# Patient Record
Sex: Female | Born: 1985 | Race: Black or African American | Hispanic: No | Marital: Single | Smoking: Former smoker
Health system: Southern US, Community
[De-identification: ages and names within clinical notes are randomized; demographics above are authoritative.]

## PROBLEM LIST (undated history)

## (undated) DIAGNOSIS — Z3491 Encounter for supervision of normal pregnancy, unspecified, first trimester: Secondary | ICD-10-CM

## (undated) DIAGNOSIS — I10 Essential (primary) hypertension: Secondary | ICD-10-CM

## (undated) HISTORY — PX: MOUTH SURGERY: SHX715

---

## 2008-09-10 ENCOUNTER — Emergency Department (HOSPITAL_BASED_OUTPATIENT_CLINIC_OR_DEPARTMENT_OTHER): Admission: EM | Admit: 2008-09-10 | Discharge: 2008-09-10 | Payer: Self-pay | Admitting: Emergency Medicine

## 2010-03-01 ENCOUNTER — Emergency Department (HOSPITAL_BASED_OUTPATIENT_CLINIC_OR_DEPARTMENT_OTHER): Admission: EM | Admit: 2010-03-01 | Discharge: 2010-03-01 | Payer: Self-pay | Admitting: Emergency Medicine

## 2010-05-07 ENCOUNTER — Emergency Department (HOSPITAL_BASED_OUTPATIENT_CLINIC_OR_DEPARTMENT_OTHER)
Admission: EM | Admit: 2010-05-07 | Discharge: 2010-05-07 | Payer: Self-pay | Source: Home / Self Care | Admitting: Emergency Medicine

## 2010-07-29 LAB — WET PREP, GENITAL
Trich, Wet Prep: NONE SEEN
Yeast Wet Prep HPF POC: NONE SEEN

## 2010-07-29 LAB — URINE MICROSCOPIC-ADD ON

## 2010-07-29 LAB — URINALYSIS, ROUTINE W REFLEX MICROSCOPIC
Glucose, UA: NEGATIVE mg/dL
Hgb urine dipstick: NEGATIVE
Specific Gravity, Urine: 1.024 (ref 1.005–1.030)
Urobilinogen, UA: 0.2 mg/dL (ref 0.0–1.0)
pH: 7.5 (ref 5.0–8.0)

## 2010-07-29 LAB — GC/CHLAMYDIA PROBE AMP, GENITAL: Chlamydia, DNA Probe: NEGATIVE

## 2010-07-30 ENCOUNTER — Emergency Department (HOSPITAL_BASED_OUTPATIENT_CLINIC_OR_DEPARTMENT_OTHER)
Admission: EM | Admit: 2010-07-30 | Discharge: 2010-07-30 | Disposition: A | Discharge status: Home / Self Care | Payer: BC Managed Care – PPO | Attending: Emergency Medicine | Admitting: Emergency Medicine

## 2010-07-30 ENCOUNTER — Inpatient Hospital Stay (HOSPITAL_COMMUNITY)
Admission: AD | Admit: 2010-07-30 | Discharge: 2010-07-30 | Disposition: A | Discharge status: Home / Self Care | Payer: BC Managed Care – PPO | Source: Ambulatory Visit | Attending: Obstetrics & Gynecology | Admitting: Obstetrics & Gynecology

## 2010-07-30 ENCOUNTER — Encounter (HOSPITAL_BASED_OUTPATIENT_CLINIC_OR_DEPARTMENT_OTHER): Payer: Self-pay | Admitting: Radiology

## 2010-07-30 ENCOUNTER — Emergency Department (INDEPENDENT_AMBULATORY_CARE_PROVIDER_SITE_OTHER): Payer: BC Managed Care – PPO

## 2010-07-30 DIAGNOSIS — O2690 Pregnancy related conditions, unspecified, unspecified trimester: Secondary | ICD-10-CM | POA: Insufficient documentation

## 2010-07-30 DIAGNOSIS — O9989 Other specified diseases and conditions complicating pregnancy, childbirth and the puerperium: Secondary | ICD-10-CM

## 2010-07-30 DIAGNOSIS — R109 Unspecified abdominal pain: Secondary | ICD-10-CM | POA: Insufficient documentation

## 2010-07-30 DIAGNOSIS — O210 Mild hyperemesis gravidarum: Secondary | ICD-10-CM

## 2010-07-30 DIAGNOSIS — O99891 Other specified diseases and conditions complicating pregnancy: Secondary | ICD-10-CM | POA: Insufficient documentation

## 2010-07-30 LAB — WET PREP, GENITAL

## 2010-07-30 LAB — URINALYSIS, ROUTINE W REFLEX MICROSCOPIC
Bilirubin Urine: NEGATIVE
Nitrite: NEGATIVE
Specific Gravity, Urine: 1.02 (ref 1.005–1.030)
pH: 6.5 (ref 5.0–8.0)

## 2010-07-30 LAB — HCG, QUANTITATIVE, PREGNANCY: hCG, Beta Chain, Quant, S: 2579 m[IU]/mL — ABNORMAL HIGH (ref <5)

## 2010-07-30 LAB — URINE MICROSCOPIC-ADD ON

## 2010-07-31 LAB — GC/CHLAMYDIA PROBE AMP, GENITAL
Chlamydia, DNA Probe: NEGATIVE
GC Probe Amp, Genital: NEGATIVE

## 2010-07-31 LAB — URINE CULTURE

## 2010-08-01 ENCOUNTER — Inpatient Hospital Stay (HOSPITAL_COMMUNITY): Payer: BC Managed Care – PPO

## 2010-08-01 ENCOUNTER — Inpatient Hospital Stay (HOSPITAL_COMMUNITY)
Admission: AD | Admit: 2010-08-01 | Discharge: 2010-08-01 | Disposition: A | Discharge status: Home / Self Care | Payer: BC Managed Care – PPO | Source: Ambulatory Visit | Attending: Family Medicine | Admitting: Family Medicine

## 2010-08-01 ENCOUNTER — Other Ambulatory Visit (HOSPITAL_COMMUNITY): Payer: BC Managed Care – PPO

## 2010-08-01 DIAGNOSIS — O99891 Other specified diseases and conditions complicating pregnancy: Secondary | ICD-10-CM | POA: Insufficient documentation

## 2010-08-01 DIAGNOSIS — O9989 Other specified diseases and conditions complicating pregnancy, childbirth and the puerperium: Secondary | ICD-10-CM

## 2010-08-01 DIAGNOSIS — O368390 Maternal care for abnormalities of the fetal heart rate or rhythm, unspecified trimester, not applicable or unspecified: Secondary | ICD-10-CM

## 2010-08-01 LAB — CBC
HCT: 28.3 % — ABNORMAL LOW (ref 36.0–46.0)
Hemoglobin: 9.3 g/dL — ABNORMAL LOW (ref 12.0–15.0)
MCH: 27 pg (ref 26.0–34.0)
MCHC: 33 g/dL (ref 30.0–36.0)

## 2010-08-01 LAB — COMPREHENSIVE METABOLIC PANEL
ALT: 17 U/L (ref 0–35)
CO2: 25 mEq/L (ref 19–32)
Calcium: 8.8 mg/dL (ref 8.4–10.5)
Chloride: 108 mEq/L (ref 96–112)
Creatinine, Ser: 0.8 mg/dL (ref 0.4–1.2)
GFR calc non Af Amer: >60 mL/min (ref >60)
Glucose, Bld: 102 mg/dL — ABNORMAL HIGH (ref 70–99)
Sodium: 138 mEq/L (ref 135–145)
Total Bilirubin: 0.4 mg/dL (ref 0.3–1.2)

## 2010-08-01 LAB — LIPASE, BLOOD: Lipase: 39 U/L (ref 23–300)

## 2010-08-01 LAB — URINE CULTURE

## 2010-08-01 LAB — URINE MICROSCOPIC-ADD ON

## 2010-08-01 LAB — DIFFERENTIAL
Basophils Absolute: 0 10*3/uL (ref 0.0–0.1)
Eosinophils Absolute: 0 10*3/uL (ref 0.0–0.7)
Lymphs Abs: 2 10*3/uL (ref 0.7–4.0)
Neutrophils Relative %: 58 % (ref 43–77)

## 2010-08-01 LAB — URINALYSIS, ROUTINE W REFLEX MICROSCOPIC
Glucose, UA: NEGATIVE mg/dL
Hgb urine dipstick: NEGATIVE
Specific Gravity, Urine: 1.028 (ref 1.005–1.030)
pH: 7 (ref 5.0–8.0)

## 2010-08-01 LAB — HCG, QUANTITATIVE, PREGNANCY: hCG, Beta Chain, Quant, S: 4715 m[IU]/mL — ABNORMAL HIGH (ref <5)

## 2010-09-02 ENCOUNTER — Other Ambulatory Visit (HOSPITAL_COMMUNITY): Payer: Self-pay | Admitting: Obstetrics and Gynecology

## 2010-09-02 DIAGNOSIS — Z3682 Encounter for antenatal screening for nuchal translucency: Secondary | ICD-10-CM

## 2010-09-18 ENCOUNTER — Ambulatory Visit (HOSPITAL_COMMUNITY)
Admission: RE | Admit: 2010-09-18 | Discharge: 2010-09-18 | Disposition: A | Discharge status: Home / Self Care | Payer: BC Managed Care – PPO | Source: Ambulatory Visit | Attending: Obstetrics and Gynecology | Admitting: Obstetrics and Gynecology

## 2010-09-18 ENCOUNTER — Other Ambulatory Visit (HOSPITAL_COMMUNITY): Payer: Self-pay | Admitting: Obstetrics and Gynecology

## 2010-09-18 ENCOUNTER — Ambulatory Visit (HOSPITAL_COMMUNITY)
Admission: RE | Admit: 2010-09-18 | Payer: BC Managed Care – PPO | Source: Ambulatory Visit | Attending: *Deleted | Admitting: *Deleted

## 2010-09-18 DIAGNOSIS — O09299 Supervision of pregnancy with other poor reproductive or obstetric history, unspecified trimester: Secondary | ICD-10-CM | POA: Insufficient documentation

## 2010-09-18 DIAGNOSIS — O351XX0 Maternal care for (suspected) chromosomal abnormality in fetus, not applicable or unspecified: Secondary | ICD-10-CM | POA: Insufficient documentation

## 2010-09-18 DIAGNOSIS — Z3682 Encounter for antenatal screening for nuchal translucency: Secondary | ICD-10-CM

## 2010-09-18 DIAGNOSIS — Z3689 Encounter for other specified antenatal screening: Secondary | ICD-10-CM | POA: Insufficient documentation

## 2010-09-18 DIAGNOSIS — O3510X0 Maternal care for (suspected) chromosomal abnormality in fetus, unspecified, not applicable or unspecified: Secondary | ICD-10-CM | POA: Insufficient documentation

## 2010-09-25 ENCOUNTER — Ambulatory Visit (HOSPITAL_COMMUNITY)
Admission: RE | Admit: 2010-09-25 | Discharge: 2010-09-25 | Disposition: A | Discharge status: Home / Self Care | Payer: BC Managed Care – PPO | Source: Ambulatory Visit | Attending: *Deleted | Admitting: *Deleted

## 2010-09-25 ENCOUNTER — Other Ambulatory Visit (HOSPITAL_COMMUNITY): Payer: Self-pay | Admitting: Obstetrics and Gynecology

## 2010-09-25 ENCOUNTER — Ambulatory Visit (HOSPITAL_COMMUNITY)
Admission: RE | Admit: 2010-09-25 | Discharge: 2010-09-25 | Disposition: A | Discharge status: Home / Self Care | Payer: BC Managed Care – PPO | Source: Ambulatory Visit | Attending: Obstetrics and Gynecology | Admitting: Obstetrics and Gynecology

## 2010-09-25 DIAGNOSIS — O3510X0 Maternal care for (suspected) chromosomal abnormality in fetus, unspecified, not applicable or unspecified: Secondary | ICD-10-CM | POA: Insufficient documentation

## 2010-09-25 DIAGNOSIS — Z0489 Encounter for examination and observation for other specified reasons: Secondary | ICD-10-CM

## 2010-09-25 DIAGNOSIS — Z3689 Encounter for other specified antenatal screening: Secondary | ICD-10-CM | POA: Insufficient documentation

## 2010-09-25 DIAGNOSIS — O351XX0 Maternal care for (suspected) chromosomal abnormality in fetus, not applicable or unspecified: Secondary | ICD-10-CM | POA: Insufficient documentation

## 2010-09-25 DIAGNOSIS — O09299 Supervision of pregnancy with other poor reproductive or obstetric history, unspecified trimester: Secondary | ICD-10-CM | POA: Insufficient documentation

## 2010-09-25 DIAGNOSIS — Z3682 Encounter for antenatal screening for nuchal translucency: Secondary | ICD-10-CM

## 2010-10-30 ENCOUNTER — Other Ambulatory Visit (HOSPITAL_COMMUNITY): Payer: Self-pay | Admitting: Obstetrics and Gynecology

## 2010-10-30 ENCOUNTER — Ambulatory Visit (HOSPITAL_COMMUNITY)
Admission: RE | Admit: 2010-10-30 | Discharge: 2010-10-30 | Disposition: A | Discharge status: Home / Self Care | Payer: BC Managed Care – PPO | Source: Ambulatory Visit | Attending: Obstetrics and Gynecology | Admitting: Obstetrics and Gynecology

## 2010-10-30 DIAGNOSIS — Z363 Encounter for antenatal screening for malformations: Secondary | ICD-10-CM | POA: Insufficient documentation

## 2010-10-30 DIAGNOSIS — Z0489 Encounter for examination and observation for other specified reasons: Secondary | ICD-10-CM

## 2010-10-30 DIAGNOSIS — O358XX0 Maternal care for other (suspected) fetal abnormality and damage, not applicable or unspecified: Secondary | ICD-10-CM | POA: Insufficient documentation

## 2010-10-30 DIAGNOSIS — Z1389 Encounter for screening for other disorder: Secondary | ICD-10-CM | POA: Insufficient documentation

## 2011-06-24 IMAGING — US US OB DETAIL+14 WK
1 series · 14 of 28 positions shown · non-contrast
Comparison: none

[Series 1: us ob detail+14 wk · 14 of 93 slices shown]
[im 4/93]
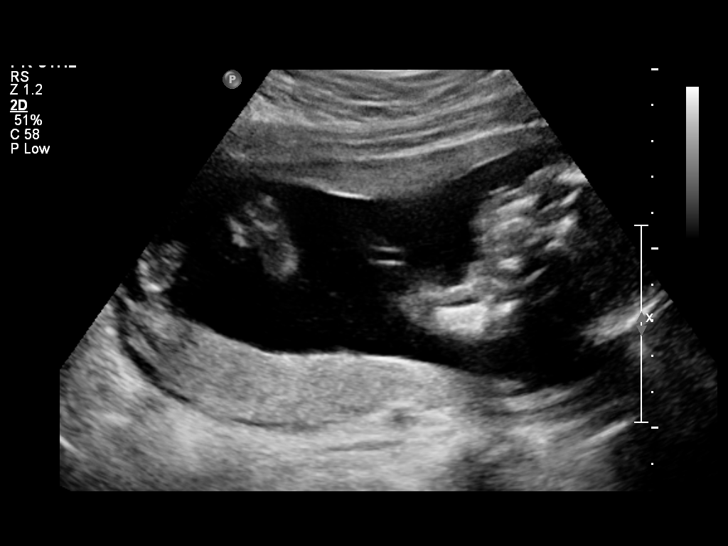
[im 11/93]
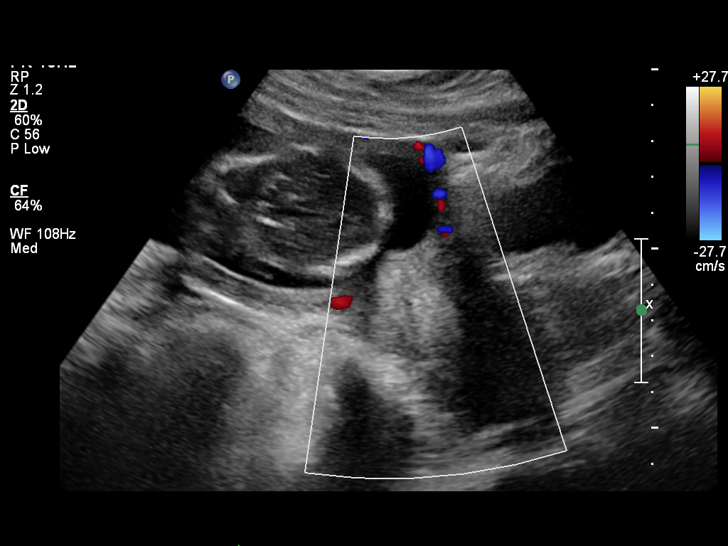
[im 18/93]
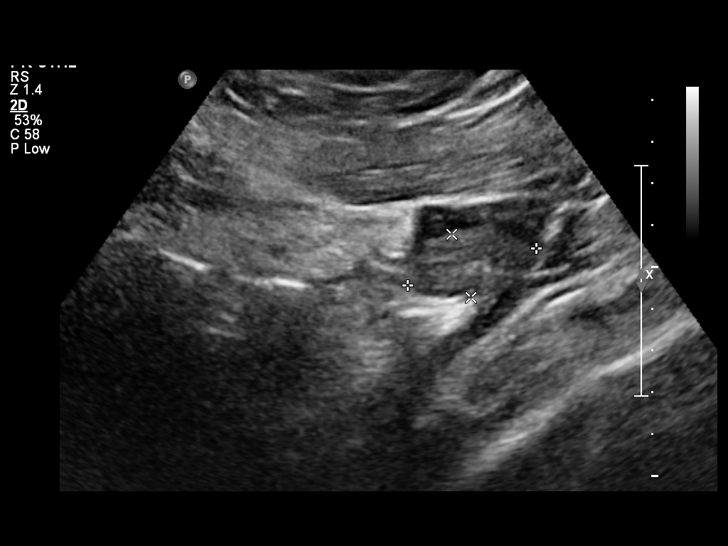
[im 24/93]
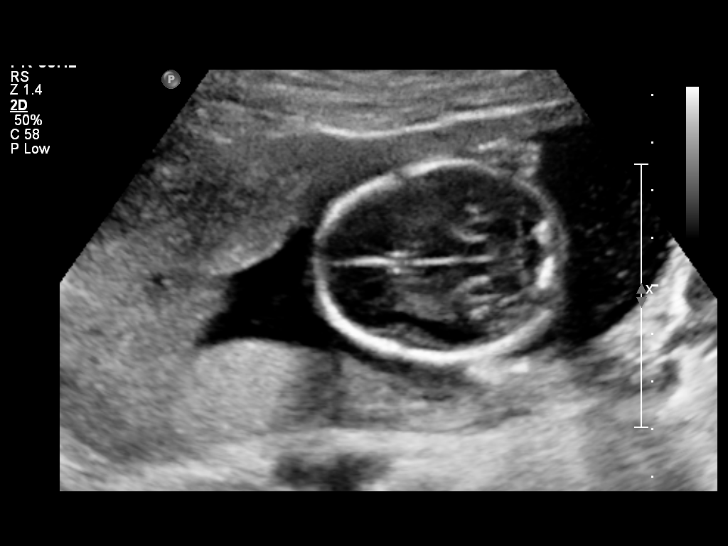
[im 31/93]
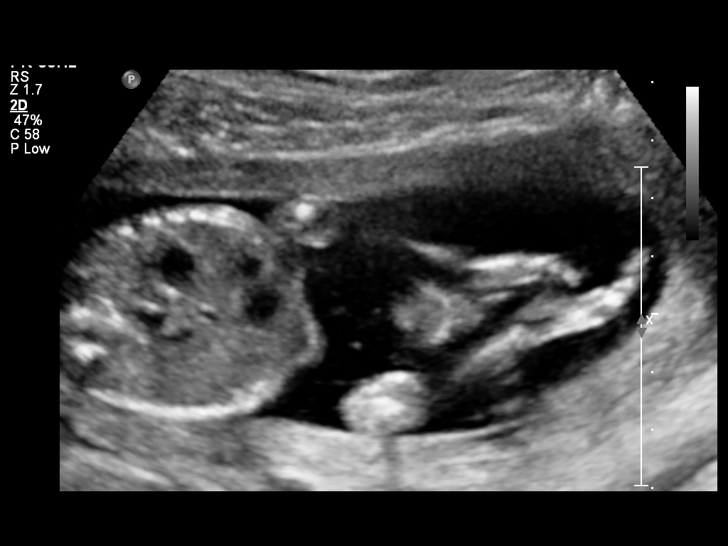
[im 38/93]
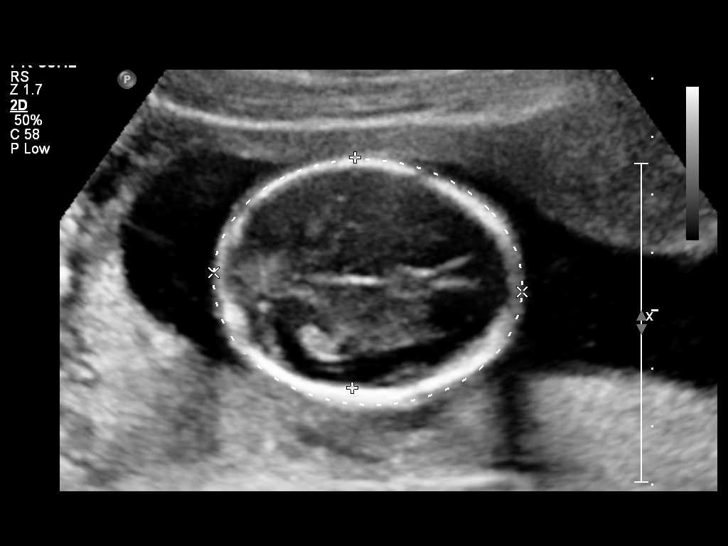
[im 45/93]
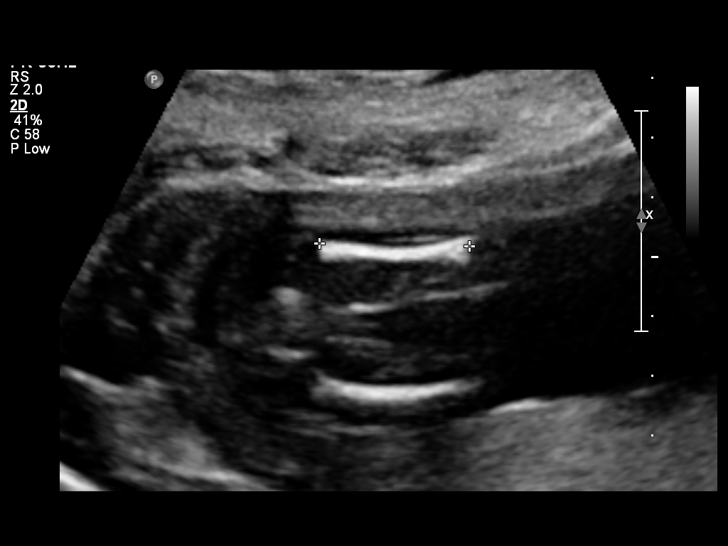
[im 52/93]
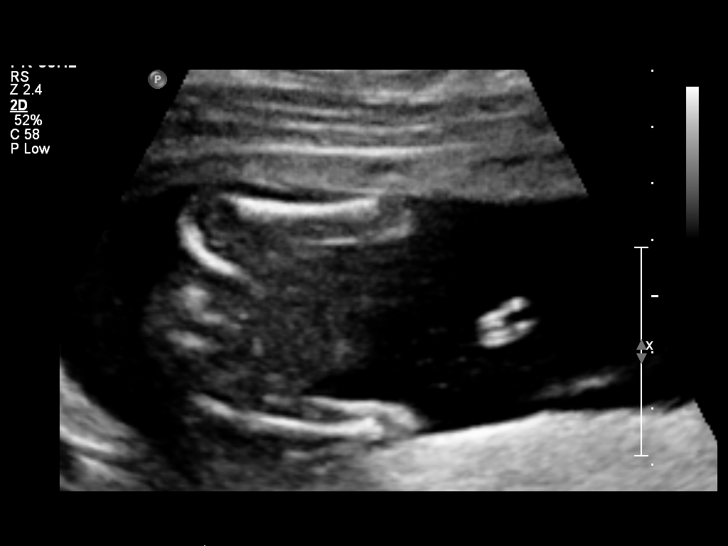
[im 58/93]
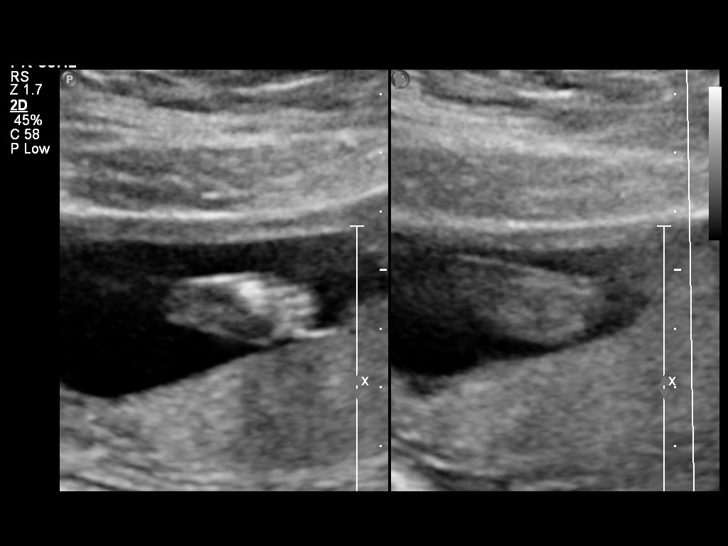
[im 65/93]
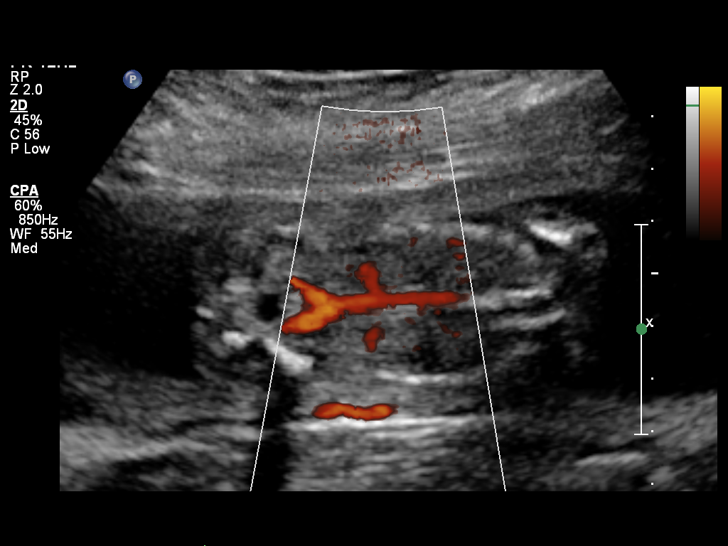
[im 72/93]
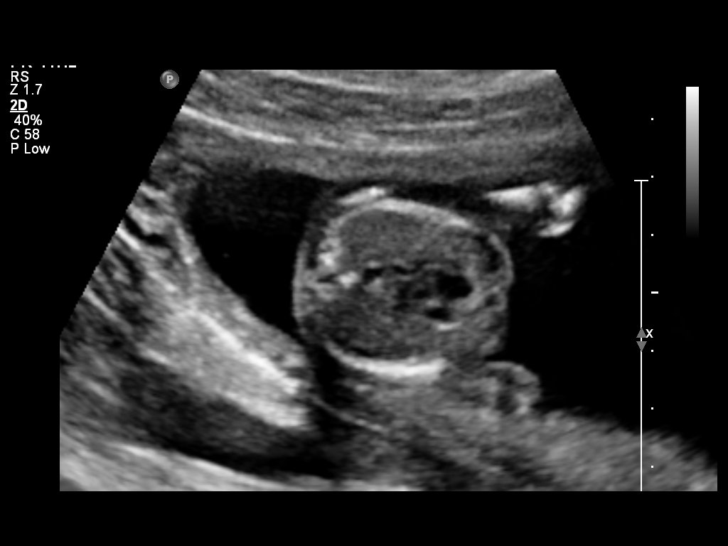
[im 79/93]
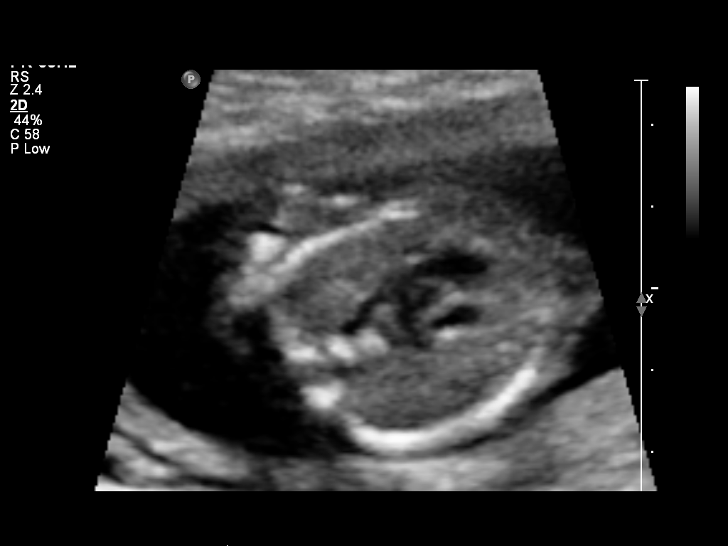
[im 86/93]
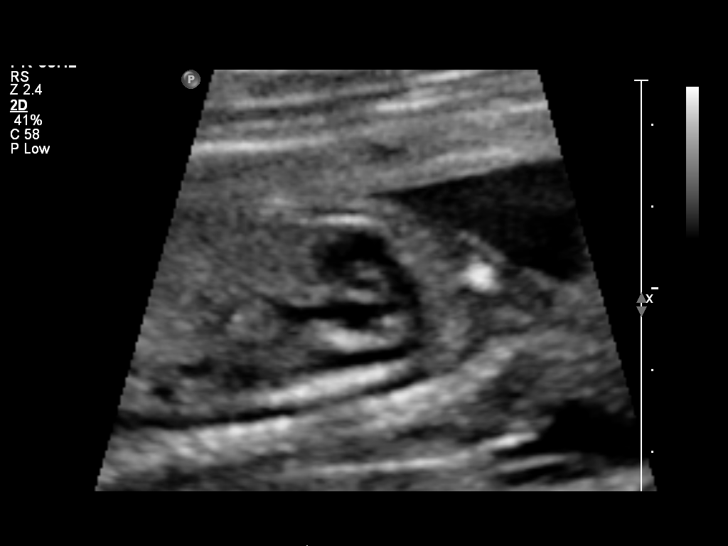
[im 93/93]
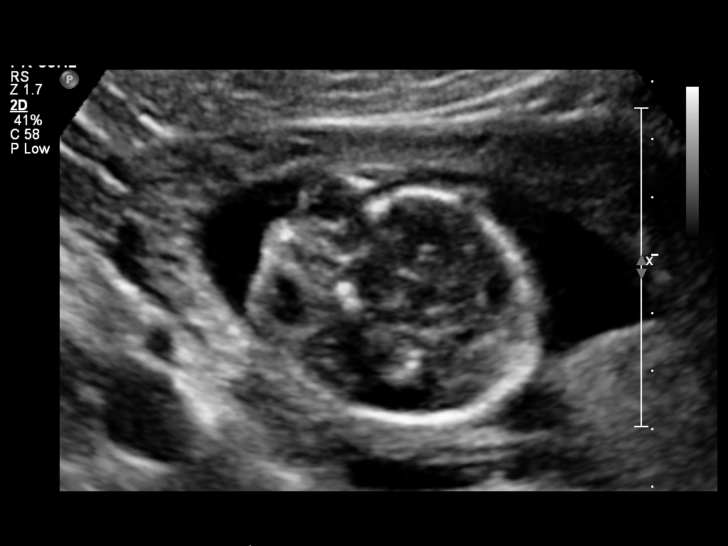

[14 of 28 positions shown; findings below may reference images not displayed]

Canned report from images found in remote index.

Refer to host system for actual result text.

## 2012-05-06 ENCOUNTER — Emergency Department (HOSPITAL_BASED_OUTPATIENT_CLINIC_OR_DEPARTMENT_OTHER)
Admission: EM | Admit: 2012-05-06 | Discharge: 2012-05-06 | Disposition: A | Discharge status: Home / Self Care | Payer: Self-pay | Attending: Emergency Medicine | Admitting: Emergency Medicine

## 2012-05-06 ENCOUNTER — Encounter (HOSPITAL_BASED_OUTPATIENT_CLINIC_OR_DEPARTMENT_OTHER): Payer: Self-pay | Admitting: Family Medicine

## 2012-05-06 DIAGNOSIS — R51 Headache: Secondary | ICD-10-CM | POA: Insufficient documentation

## 2012-05-06 DIAGNOSIS — B9789 Other viral agents as the cause of diseases classified elsewhere: Secondary | ICD-10-CM | POA: Insufficient documentation

## 2012-05-06 DIAGNOSIS — B349 Viral infection, unspecified: Secondary | ICD-10-CM

## 2012-05-06 DIAGNOSIS — J3489 Other specified disorders of nose and nasal sinuses: Secondary | ICD-10-CM | POA: Insufficient documentation

## 2012-05-06 DIAGNOSIS — J029 Acute pharyngitis, unspecified: Secondary | ICD-10-CM | POA: Insufficient documentation

## 2012-05-06 DIAGNOSIS — R0982 Postnasal drip: Secondary | ICD-10-CM | POA: Insufficient documentation

## 2012-05-06 MED ORDER — DEXAMETHASONE SODIUM PHOSPHATE 10 MG/ML IJ SOLN
10.0000 mg | Freq: Once | INTRAMUSCULAR | Status: AC
  Administered 2012-05-06: 10 mg via INTRAMUSCULAR

## 2012-05-06 MED ORDER — LIDOCAINE VISCOUS 2 % MT SOLN: 20.0000 mL | OROMUCOSAL | Status: DC | PRN

## 2012-05-06 MED ORDER — IBUPROFEN 400 MG PO TABS
600.0000 mg | ORAL_TABLET | Freq: Once | ORAL | Status: AC
  Administered 2012-05-06: 600 mg via ORAL
  Filled 2012-05-06: qty 1

## 2012-05-06 MED ORDER — DEXAMETHASONE SODIUM PHOSPHATE 10 MG/ML IJ SOLN
10.0000 mg | Freq: Once | INTRAMUSCULAR | Status: DC
  Filled 2012-05-06: qty 1

## 2012-05-06 MED ORDER — IBUPROFEN 600 MG PO TABS: 600.0000 mg | ORAL_TABLET | Freq: Four times a day (QID) | ORAL | Status: DC | PRN

## 2012-05-06 MED ORDER — LIDOCAINE VISCOUS 2 % MT SOLN
20.0000 mL | Freq: Once | OROMUCOSAL | Status: AC
  Administered 2012-05-06: 20 mL via OROMUCOSAL
  Filled 2012-05-06: qty 15

## 2012-05-06 NOTE — ED Provider Notes (Signed)
History     CSN: [id number]  Arrival date & time 05/06/12  1018   First MD Initiated Contact with Patient 05/06/12 1200      Chief Complaint  Patient presents with  . Sore Throat    (Consider location/radiation/quality/duration/timing/severity/associated sxs/prior treatment) HPI Comments: Pt comes in with cc of sore throat and a headache. Pt's sx started few days back with some congestion and tearing of the eye - but stating last night she has been having sore throat, with some pain with swallowing and change in voice. No fevers, chills. Pt is coughing - mostly celar, but some yellow discharge mixed as well. She has no wheezing, no hx of lung disease, and she is not a smoker. Pt also has a mild headache, no meds taken, with no nausea, vomiting, visual complains, seizures, altered mental status, loss of consciousness, new weakness, or numbness, no gait instability.  Patient is a 26 y.o. female presenting with pharyngitis. The history is provided by the patient.  Sore Throat Pertinent negatives include no chest pain, no abdominal pain, no headaches and no shortness of breath.    History reviewed. No pertinent past medical history.  History reviewed. No pertinent past surgical history.  No family history on file.  History  Substance Use Topics  . Smoking status: Never Smoker   . Smokeless tobacco: Not on file  . Alcohol Use: Yes    OB History    Grav Para Term Preterm Abortions TAB SAB Ect Mult Living   1               Review of Systems  HENT: Positive for congestion, sore throat and postnasal drip. Negative for drooling, trouble swallowing and neck pain.   Eyes: Negative for visual disturbance.  Respiratory: Negative for shortness of breath.   Cardiovascular: Negative for chest pain.  Gastrointestinal: Negative for nausea, vomiting and abdominal pain.  Genitourinary: Negative for dysuria.  Neurological: Negative for headaches.    Allergies  Review of patient's  allergies indicates no known allergies.  Home Medications   Current Outpatient Rx  Name  Route  Sig  Dispense  Refill  . IBUPROFEN 600 MG PO TABS   Oral   Take 1 tablet (600 mg total) by mouth every 6 (six) hours as needed for pain.   30 tablet   0   . LIDOCAINE VISCOUS 2 % MT SOLN   Oral   Take 20 mLs by mouth as needed for pain.   100 mL   0     BP 124/85  Pulse 84  Temp 98.4 F (36.9 C) (Oral)  Resp 16  Ht 5\' 3"  (1.6 m)  Wt 237 lb (107.502 kg)  BMI 41.98 kg/m2  SpO2 100%  LMP 04/19/2012  Breastfeeding? Unknown  Physical Exam  Nursing note and vitals reviewed. Constitutional: She is oriented to person, place, and time. She appears well-developed and well-nourished.  HENT:  Head: Normocephalic and atraumatic.  Mouth/Throat: No oropharyngeal exudate.  Eyes: EOM are normal. Pupils are equal, round, and reactive to light.  Neck: Neck supple. No JVD present.  Cardiovascular: Normal rate, regular rhythm and normal heart sounds.   No murmur heard. Pulmonary/Chest: Effort normal. No respiratory distress.  Abdominal: Soft. She exhibits no distension. There is no tenderness. There is no rebound and no guarding.  Lymphadenopathy:    She has cervical adenopathy.  Neurological: She is alert and oriented to person, place, and time. No cranial nerve deficit. Coordination normal.  Skin: Skin  is warm and dry.    ED Course  Procedures (including critical care time)   Labs Reviewed  RAPID STREP SCREEN   No results found.   1. Viral syndrome   2. Pharyngitis       MDM  Non toxic appearing female comes in with cc of sore throat and a mild headache.  Pt's exam is benign. Has cervical lymphadenopathy, but no other abnormalities. Headache has no redflags either.  Likely viral syndrome.    Derwood Kaplan, MD 05/06/12 1441

## 2012-05-06 NOTE — ED Notes (Signed)
Pt c/o headache, dry cough and sore throat x 2 days. Denies fever, n/v/d.

## 2014-03-20 ENCOUNTER — Encounter (HOSPITAL_BASED_OUTPATIENT_CLINIC_OR_DEPARTMENT_OTHER): Payer: Self-pay | Admitting: Family Medicine

## 2015-08-31 ENCOUNTER — Encounter (HOSPITAL_BASED_OUTPATIENT_CLINIC_OR_DEPARTMENT_OTHER): Payer: Self-pay | Admitting: *Deleted

## 2015-08-31 DIAGNOSIS — S199XXA Unspecified injury of neck, initial encounter: Secondary | ICD-10-CM | POA: Diagnosis not present

## 2015-08-31 DIAGNOSIS — R51 Headache: Secondary | ICD-10-CM | POA: Insufficient documentation

## 2015-08-31 DIAGNOSIS — S8002XA Contusion of left knee, initial encounter: Secondary | ICD-10-CM | POA: Insufficient documentation

## 2015-08-31 DIAGNOSIS — Y939 Activity, unspecified: Secondary | ICD-10-CM | POA: Diagnosis not present

## 2015-08-31 DIAGNOSIS — M79604 Pain in right leg: Secondary | ICD-10-CM | POA: Diagnosis not present

## 2015-08-31 DIAGNOSIS — Y999 Unspecified external cause status: Secondary | ICD-10-CM | POA: Insufficient documentation

## 2015-08-31 DIAGNOSIS — S8992XA Unspecified injury of left lower leg, initial encounter: Secondary | ICD-10-CM | POA: Diagnosis present

## 2015-08-31 DIAGNOSIS — M549 Dorsalgia, unspecified: Secondary | ICD-10-CM | POA: Insufficient documentation

## 2015-08-31 DIAGNOSIS — Y929 Unspecified place or not applicable: Secondary | ICD-10-CM | POA: Insufficient documentation
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
## 2015-09-01 ENCOUNTER — Encounter (HOSPITAL_BASED_OUTPATIENT_CLINIC_OR_DEPARTMENT_OTHER): Payer: Self-pay | Admitting: Emergency Medicine

## 2015-09-01 ENCOUNTER — Emergency Department (HOSPITAL_BASED_OUTPATIENT_CLINIC_OR_DEPARTMENT_OTHER)
Admission: EM | Admit: 2015-09-01 | Discharge: 2015-09-01 | Disposition: A | Discharge status: Home / Self Care | Payer: No Typology Code available for payment source | Attending: Emergency Medicine | Admitting: Emergency Medicine

## 2015-09-01 DIAGNOSIS — T148XXA Other injury of unspecified body region, initial encounter: Secondary | ICD-10-CM

## 2015-09-01 MED ORDER — NAPROXEN 250 MG PO TABS
500.0000 mg | ORAL_TABLET | Freq: Once | ORAL | Status: AC
  Administered 2015-09-01: 500 mg via ORAL
  Filled 2015-09-01: qty 2

## 2015-09-01 MED ORDER — METHOCARBAMOL 500 MG PO TABS: 500.0000 mg | ORAL_TABLET | Freq: Two times a day (BID) | ORAL | Status: DC

## 2015-09-01 MED ORDER — METHOCARBAMOL 500 MG PO TABS
1000.0000 mg | ORAL_TABLET | Freq: Once | ORAL | Status: AC
  Administered 2015-09-01: 1000 mg via ORAL
  Filled 2015-09-01: qty 2

## 2015-09-01 MED ORDER — NAPROXEN 375 MG PO TABS: 375.0000 mg | ORAL_TABLET | Freq: Two times a day (BID) | ORAL | Status: DC
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
## 2015-09-22 ENCOUNTER — Encounter (HOSPITAL_BASED_OUTPATIENT_CLINIC_OR_DEPARTMENT_OTHER): Payer: Self-pay | Admitting: *Deleted

## 2015-09-22 ENCOUNTER — Emergency Department (HOSPITAL_BASED_OUTPATIENT_CLINIC_OR_DEPARTMENT_OTHER)
Admission: EM | Admit: 2015-09-22 | Discharge: 2015-09-22 | Disposition: A | Discharge status: Home / Self Care | Payer: No Typology Code available for payment source | Attending: Emergency Medicine | Admitting: Emergency Medicine

## 2015-09-22 DIAGNOSIS — A084 Viral intestinal infection, unspecified: Secondary | ICD-10-CM

## 2015-09-22 DIAGNOSIS — Z79899 Other long term (current) drug therapy: Secondary | ICD-10-CM | POA: Insufficient documentation

## 2015-09-22 LAB — CBC WITH DIFFERENTIAL/PLATELET
BASOS PCT: 0 %
Basophils Absolute: 0 10*3/uL (ref 0.0–0.1)
EOS ABS: 0 10*3/uL (ref 0.0–0.7)
Eosinophils Relative: 0 %
HEMATOCRIT: 34.8 % — AB (ref 36.0–46.0)
HEMOGLOBIN: 11.3 g/dL — AB (ref 12.0–15.0)
LYMPHS ABS: 0.7 10*3/uL (ref 0.7–4.0)
Lymphocytes Relative: 15 %
MCH: 28.5 pg (ref 26.0–34.0)
MCHC: 32.5 g/dL (ref 30.0–36.0)
MCV: 87.7 fL (ref 78.0–100.0)
Monocytes Absolute: 0.3 10*3/uL (ref 0.1–1.0)
Monocytes Relative: 7 %
NEUTROS ABS: 3.8 10*3/uL (ref 1.7–7.7)
Neutrophils Relative %: 78 %
Platelets: 263 10*3/uL (ref 150–400)
RBC: 3.97 MIL/uL (ref 3.87–5.11)
RDW: 12.4 % (ref 11.5–15.5)
WBC: 4.8 10*3/uL (ref 4.0–10.5)

## 2015-09-22 LAB — COMPREHENSIVE METABOLIC PANEL
ALBUMIN: 3.5 g/dL (ref 3.5–5.0)
ALK PHOS: 91 U/L (ref 38–126)
ALT: 26 U/L (ref 14–54)
AST: 22 U/L (ref 15–41)
Anion gap: 5 (ref 5–15)
BILIRUBIN TOTAL: 0.6 mg/dL (ref 0.3–1.2)
BUN: 10 mg/dL (ref 6–20)
CALCIUM: 8.8 mg/dL — AB (ref 8.9–10.3)
CO2: 26 mmol/L (ref 22–32)
CREATININE: 0.9 mg/dL (ref 0.44–1.00)
Chloride: 104 mmol/L (ref 101–111)
GFR calc Af Amer: >60 mL/min (ref >60)
GFR calc non Af Amer: >60 mL/min (ref >60)
GLUCOSE: 86 mg/dL (ref 65–99)
Potassium: 3.3 mmol/L — ABNORMAL LOW (ref 3.5–5.1)
SODIUM: 135 mmol/L (ref 135–145)
Total Protein: 7.1 g/dL (ref 6.5–8.1)

## 2015-09-22 LAB — URINALYSIS, ROUTINE W REFLEX MICROSCOPIC
BILIRUBIN URINE: NEGATIVE
Glucose, UA: NEGATIVE mg/dL
Ketones, ur: 40 mg/dL — AB
NITRITE: NEGATIVE
Protein, ur: NEGATIVE mg/dL
SPECIFIC GRAVITY, URINE: 1.024 (ref 1.005–1.030)
pH: 6.5 (ref 5.0–8.0)

## 2015-09-22 LAB — URINE MICROSCOPIC-ADD ON

## 2015-09-22 LAB — LIPASE, BLOOD: Lipase: 15 U/L (ref 11–51)

## 2015-09-22 LAB — PREGNANCY, URINE: PREG TEST UR: NEGATIVE

## 2015-09-22 MED ORDER — ONDANSETRON HCL 4 MG/2ML IJ SOLN
4.0000 mg | Freq: Once | INTRAMUSCULAR | Status: AC
  Administered 2015-09-22: 4 mg via INTRAVENOUS
  Filled 2015-09-22: qty 2

## 2015-09-22 MED ORDER — DICYCLOMINE HCL 20 MG PO TABS: 20.0000 mg | ORAL_TABLET | Freq: Two times a day (BID) | ORAL | Status: DC

## 2015-09-22 MED ORDER — ONDANSETRON 4 MG PO TBDP: 4.0000 mg | ORAL_TABLET | Freq: Three times a day (TID) | ORAL | Status: DC | PRN

## 2015-09-22 MED ORDER — DIPHENOXYLATE-ATROPINE 2.5-0.025 MG PO TABS: 1.0000 | ORAL_TABLET | Freq: Four times a day (QID) | ORAL | Status: DC | PRN

## 2015-09-22 MED ORDER — PANTOPRAZOLE SODIUM 40 MG IV SOLR
40.0000 mg | Freq: Once | INTRAVENOUS | Status: AC
Start: 2015-09-22 — End: 2015-09-22
  Administered 2015-09-22: 40 mg via INTRAVENOUS
  Filled 2015-09-22: qty 40

## 2015-09-22 MED ORDER — MORPHINE SULFATE (PF) 4 MG/ML IV SOLN
4.0000 mg | INTRAVENOUS | Status: DC | PRN
  Administered 2015-09-22: 4 mg via INTRAVENOUS
  Filled 2015-09-22: qty 1

## 2015-09-22 NOTE — ED Provider Notes (Signed)
CSN: [id number]     Arrival date & time 09/22/15  4098 History  By signing my name below, I, [facility], attest that this documentation has been prepared under the direction and in the presence of Rolland Porter, MD. Electronically Signed: Randell Patient, ED Scribe. 09/22/2015. 8:40 PM.   Chief Complaint  Patient presents with  . Abdominal Pain   The history is provided by the patient. No language interpreter was used.   HPI Comments: Dawn Heath is a 30 y.o. female who presents to the Emergency Department complaining of constant, 8/10 abdominal pain onset last night. She describes that pain as cramping like contractions. Pt states that she began to vomit 18 hours ago. She reports associated nausea, chills, and diarrhea this morning upon waking. She is an occasional ETOH consumer. Denies hx of abdominal surgeries, cholecystitis, or any other chronic conditions. Denies frequent use of NSAIDs, ibuprofen, Tylenol or any other medications. Denies hematemesis, hematochezia, melena, or any other symptoms.  History reviewed. No pertinent past medical history. History reviewed. No pertinent past surgical history. History reviewed. No pertinent family history. Social History  Substance Use Topics  . Smoking status: Never Smoker   . Smokeless tobacco: None  . Alcohol Use: Yes     Comment: social   OB History    Gravida Para Term Preterm AB TAB SAB Ectopic Multiple Living   1              Review of Systems  Constitutional: Positive for chills. Negative for fever, diaphoresis, appetite change and fatigue.  HENT: Negative for mouth sores, sore throat and trouble swallowing.   Eyes: Negative for visual disturbance.  Respiratory: Negative for cough, chest tightness, shortness of breath and wheezing.   Cardiovascular: Negative for chest pain.  Gastrointestinal: Positive for nausea, vomiting and abdominal pain. Negative for diarrhea, blood in stool and abdominal distention.  Endocrine:  Negative for polydipsia, polyphagia and polyuria.  Genitourinary: Negative for dysuria, frequency and hematuria.  Musculoskeletal: Negative for gait problem.  Skin: Negative for color change, pallor and rash.  Neurological: Negative for dizziness, syncope, light-headedness and headaches.  Hematological: Does not bruise/bleed easily.  Psychiatric/Behavioral: Negative for behavioral problems and confusion.      Allergies  Review of patient's allergies indicates no known allergies.  Home Medications   Prior to Admission medications   Medication Sig Start Date End Date Taking? Authorizing Provider  methocarbamol (ROBAXIN) 500 MG tablet Take 1 tablet (500 mg total) by mouth 2 (two) times daily. 09/01/15  Yes April Palumbo, MD  dicyclomine (BENTYL) 20 MG tablet Take 1 tablet (20 mg total) by mouth 2 (two) times daily. 09/22/15   Rolland Porter, MD  diphenoxylate-atropine (LOMOTIL) 2.5-0.025 MG tablet Take 1 tablet by mouth 4 (four) times daily as needed for diarrhea or loose stools. 09/22/15   Rolland Porter, MD  naproxen (NAPROSYN) 375 MG tablet Take 1 tablet (375 mg total) by mouth 2 (two) times daily. 09/01/15   April Palumbo, MD  ondansetron (ZOFRAN ODT) 4 MG disintegrating tablet Take 1 tablet (4 mg total) by mouth every 8 (eight) hours as needed for nausea. 09/22/15   Rolland Porter, MD   BP 118/82 mmHg  Pulse 78  Temp(Src) 99 F (37.2 C) (Oral)  Resp 18  Ht  (1.6 m)  Wt 238 lb (107.956 kg)  BMI 42.17 kg/m2  SpO2 96%  LMP 09/06/2015 (Approximate) Physical Exam  Constitutional: She is oriented to person, place, and time. She appears well-developed and well-nourished. No  distress.  HENT:  Head: Normocephalic.  Eyes: Conjunctivae are normal. Pupils are equal, round, and reactive to light. No scleral icterus.  Neck: Normal range of motion. Neck supple. No thyromegaly present.  Cardiovascular: Normal rate and regular rhythm.  Exam reveals no gallop and no friction rub.   No murmur  heard. Pulmonary/Chest: Effort normal and breath sounds normal. No respiratory distress. She has no wheezes. She has no rales.  Abdominal: Soft. Bowel sounds are normal. She exhibits no distension. There is tenderness in the epigastric area. There is no rebound.  Tenderness over the epigastrium.  Musculoskeletal: Normal range of motion.  Neurological: She is alert and oriented to person, place, and time.  Skin: Skin is warm and dry. No rash noted.  Psychiatric: She has a normal mood and affect. Her behavior is normal.    ED Course  Procedures   DIAGNOSTIC STUDIES: Oxygen Saturation is 100% on RA, normal by my interpretation.    COORDINATION OF CARE: 7:00 PM Will order labs, Zofran, Protonix, and morphine. Discussed treatment plan with pt at bedside and pt agreed to plan.  8:39 PM Returned to check on pt and discuss lab results. Will discharge home.  Labs Review Labs Reviewed  URINALYSIS, ROUTINE W REFLEX MICROSCOPIC (NOT [facility]) - Abnormal; Notable for the following:    Color, Urine AMBER (*)    APPearance CLOUDY (*)    Hgb urine dipstick SMALL (*)    Ketones, ur 40 (*)    Leukocytes, UA SMALL (*)    All other components within normal limits  CBC WITH DIFFERENTIAL/PLATELET - Abnormal; Notable for the following:    Hemoglobin 11.3 (*)    HCT 34.8 (*)    All other components within normal limits  COMPREHENSIVE METABOLIC PANEL - Abnormal; Notable for the following:    Potassium 3.3 (*)    Calcium 8.8 (*)    All other components within normal limits  URINE MICROSCOPIC-ADD ON - Abnormal; Notable for the following:    Squamous Epithelial / LPF 6-30 (*)    Bacteria, UA MANY (*)    All other components within normal limits  PREGNANCY, URINE  LIPASE, BLOOD   I have personally reviewed and evaluated these lab results as part of my medical decision-making.   MDM   Final diagnoses:  Viral gastroenteritis    I personally performed the services described in this  documentation, which was scribed in my presence. The recorded information has been reviewed and is accurate.    EKG Interpretation None         Rolland PorterMark Ellawyn Wogan, MD 09/22/15 2046

## 2015-09-22 NOTE — ED Notes (Signed)
Pt given d/c instructions as per chart. Verbalizes understanding. No questions. Rx x 3 

## 2015-09-22 NOTE — ED Notes (Signed)
Pt c/o abdominal pain with N/V since 0100hrs, states unable to keep any food or liquids down

## 2015-09-22 NOTE — ED Notes (Signed)
Up to b/r, steady gait, "feel the same, pain worse".

## 2016-01-21 ENCOUNTER — Encounter (HOSPITAL_BASED_OUTPATIENT_CLINIC_OR_DEPARTMENT_OTHER): Payer: Self-pay

## 2016-01-21 ENCOUNTER — Emergency Department (HOSPITAL_BASED_OUTPATIENT_CLINIC_OR_DEPARTMENT_OTHER)
Admission: EM | Admit: 2016-01-21 | Discharge: 2016-01-21 | Disposition: A | Discharge status: Home / Self Care | Payer: Medicaid Other | Attending: Emergency Medicine | Admitting: Emergency Medicine

## 2016-01-21 DIAGNOSIS — N943 Premenstrual tension syndrome: Secondary | ICD-10-CM | POA: Insufficient documentation

## 2016-01-21 DIAGNOSIS — G43831 Menstrual migraine, intractable, with status migrainosus: Secondary | ICD-10-CM | POA: Insufficient documentation

## 2016-01-21 DIAGNOSIS — N938 Other specified abnormal uterine and vaginal bleeding: Secondary | ICD-10-CM

## 2016-01-21 DIAGNOSIS — F17200 Nicotine dependence, unspecified, uncomplicated: Secondary | ICD-10-CM | POA: Insufficient documentation

## 2016-01-21 LAB — URINE MICROSCOPIC-ADD ON

## 2016-01-21 LAB — CBC WITH DIFFERENTIAL/PLATELET
BASOS ABS: 0 10*3/uL (ref 0.0–0.1)
BASOS PCT: 0 %
EOS PCT: 0 %
Eosinophils Absolute: 0 10*3/uL (ref 0.0–0.7)
HCT: 34.7 % — ABNORMAL LOW (ref 36.0–46.0)
Hemoglobin: 11.2 g/dL — ABNORMAL LOW (ref 12.0–15.0)
LYMPHS PCT: 31 %
Lymphs Abs: 1.6 10*3/uL (ref 0.7–4.0)
MCH: 28.8 pg (ref 26.0–34.0)
MCHC: 32.3 g/dL (ref 30.0–36.0)
MCV: 89.2 fL (ref 78.0–100.0)
MONO ABS: 0.5 10*3/uL (ref 0.1–1.0)
Monocytes Relative: 11 %
NEUTROS ABS: 2.9 10*3/uL (ref 1.7–7.7)
Neutrophils Relative %: 58 %
PLATELETS: 283 10*3/uL (ref 150–400)
RBC: 3.89 MIL/uL (ref 3.87–5.11)
RDW: 12.3 % (ref 11.5–15.5)
WBC: 5.1 10*3/uL (ref 4.0–10.5)

## 2016-01-21 LAB — WET PREP, GENITAL
Sperm: NONE SEEN
TRICH WET PREP: NONE SEEN
YEAST WET PREP: NONE SEEN

## 2016-01-21 LAB — URINALYSIS, ROUTINE W REFLEX MICROSCOPIC
Bilirubin Urine: NEGATIVE
Glucose, UA: NEGATIVE mg/dL
KETONES UR: NEGATIVE mg/dL
LEUKOCYTES UA: NEGATIVE
NITRITE: NEGATIVE
PROTEIN: NEGATIVE mg/dL
Specific Gravity, Urine: 1.02 (ref 1.005–1.030)
pH: 7.5 (ref 5.0–8.0)

## 2016-01-21 LAB — PREGNANCY, URINE: PREG TEST UR: NEGATIVE

## 2016-01-21 MED ORDER — KETOROLAC TROMETHAMINE 30 MG/ML IJ SOLN
30.0000 mg | Freq: Once | INTRAMUSCULAR | Status: AC
  Administered 2016-01-21: 30 mg via INTRAVENOUS
  Filled 2016-01-21: qty 1

## 2016-01-21 MED ORDER — MEDROXYPROGESTERONE ACETATE 5 MG PO TABS: 5.0000 mg | ORAL_TABLET | Freq: Every day | ORAL | 0 refills | Status: DC

## 2016-01-21 MED ORDER — DEXAMETHASONE SODIUM PHOSPHATE 10 MG/ML IJ SOLN
10.0000 mg | Freq: Once | INTRAMUSCULAR | Status: AC
  Administered 2016-01-21: 10 mg via INTRAVENOUS
  Filled 2016-01-21: qty 1

## 2016-01-21 MED ORDER — METOCLOPRAMIDE HCL 5 MG/ML IJ SOLN
10.0000 mg | Freq: Once | INTRAMUSCULAR | Status: AC
  Administered 2016-01-21: 10 mg via INTRAVENOUS
  Filled 2016-01-21: qty 2

## 2016-01-21 NOTE — ED Triage Notes (Signed)
C/o HA x 2 days-reports long hx of HAs-no migraine dx-NAD-steady gait

## 2016-01-21 NOTE — ED Provider Notes (Signed)
[facility]   CSN: [id number] Arrival date & time: 01/21/16  1142     History   Chief Complaint Chief Complaint  Patient presents with  . Headache    HPI Dawn Heath is a 30 y.o. female.  Pt states she has an IUD and needs removal as it has been present for 5 years.  Has not f/u with Ob/Gyn but until this last month has never had a problem with menses or heavy cycles.  Also noted over the last week an unusual smell with the bleeding.  Sexually active with 1 partner but does not use protection.   The history is provided by the patient.  Headache   This is a recurrent problem. The current episode started yesterday. The problem occurs constantly. The problem has been gradually worsening. The headache is associated with the menstrual cycle (has been having heavy vaginal bleeding 4-6 pads daily for the last month). The pain is located in the bilateral and frontal region. The quality of the pain is described as throbbing and dull. The pain is at a severity of 7/10. The pain is moderate. The pain does not radiate. Pertinent negatives include no anorexia, no malaise/fatigue, no chest pressure, no palpitations, no syncope, no nausea and no vomiting. She has tried nothing for the symptoms. The treatment provided no relief.    History reviewed. No pertinent past medical history.  There are no active problems to display for this patient.   History reviewed. No pertinent surgical history.  OB History    Gravida Para Term Preterm AB Living   1             SAB TAB Ectopic Multiple Live Births                   Home Medications    Prior to Admission medications   Medication Sig Start Date End Date Taking? Authorizing Provider  medroxyPROGESTERone (PROVERA) 5 MG tablet Take 1 tablet (5 mg total) by mouth daily. 01/21/16   Gwyneth Sprout, MD    Family History No family history on file.  Social History Social History  Substance Use Topics  . Smoking  status: Current Every Day Smoker  . Smokeless tobacco: Never Used  . Alcohol use Yes     Comment: social     Allergies   Review of patient's allergies indicates no known allergies.   Review of Systems Review of Systems  Constitutional: Negative for malaise/fatigue.  Cardiovascular: Negative for palpitations and syncope.  Gastrointestinal: Negative for anorexia, nausea and vomiting.  Neurological: Positive for headaches.  All other systems reviewed and are negative.    Physical Exam Updated Vital Signs BP 129/83 (BP Location: Left Arm)   Pulse 78   Temp 98.1 F (36.7 C) (Oral)   Resp 16   Ht 5\' 4"  (1.626 m)   Wt 238 lb (108 kg)   LMP 12/21/2015   SpO2 100%   BMI 40.85 kg/m   Physical Exam  Constitutional: She is oriented to person, place, and time. She appears well-developed and well-nourished. No distress.  HENT:  Head: Normocephalic and atraumatic.  Mouth/Throat: Oropharynx is clear and moist.  Eyes: Conjunctivae and EOM are normal. Pupils are equal, round, and reactive to light. Right eye exhibits no discharge. Left eye exhibits no discharge.  photophobia  Neck: Normal range of motion. Neck supple. No spinous process tenderness present. No neck rigidity. No Brudzinski's sign and no Kernig's sign noted.  Cardiovascular: Normal rate,  regular rhythm, normal heart sounds and intact distal pulses.   No murmur heard. Pulmonary/Chest: Effort normal and breath sounds normal. No respiratory distress. She has no wheezes. She has no rales.  Abdominal: Soft. She exhibits no distension. There is no tenderness. There is no rebound and no guarding.  Genitourinary: Uterus normal. Cervix exhibits no motion tenderness, no discharge and no friability. Right adnexum displays no mass, no tenderness and no fullness. Left adnexum displays no mass, no tenderness and no fullness. There is bleeding in the vagina. No vaginal discharge found.  Genitourinary Comments: IUD strings present and  removed with ringed forceps  Musculoskeletal: Normal range of motion. She exhibits no edema or tenderness.  Lymphadenopathy:    She has no cervical adenopathy.  Neurological: She is alert and oriented to person, place, and time. She has normal strength. No cranial nerve deficit or sensory deficit. Coordination and gait normal. GCS eye subscore is 4. GCS verbal subscore is 5. GCS motor subscore is 6.  Skin: Skin is warm and dry. No rash noted. No erythema.  Psychiatric: She has a normal mood and affect. Her behavior is normal.  Nursing note and vitals reviewed.    ED Treatments / Results  Labs (all labs ordered are listed, but only abnormal results are displayed) Labs Reviewed  WET PREP, GENITAL - Abnormal; Notable for the following:       Result Value   Clue Cells Wet Prep HPF POC PRESENT (*)    WBC, Wet Prep HPF POC FEW (*)    All other components within normal limits  CBC WITH DIFFERENTIAL/PLATELET - Abnormal; Notable for the following:    Hemoglobin 11.2 (*)    HCT 34.7 (*)    All other components within normal limits  URINALYSIS, ROUTINE W REFLEX MICROSCOPIC (NOT AT [facility]) - Abnormal; Notable for the following:    Hgb urine dipstick TRACE (*)    All other components within normal limits  URINE MICROSCOPIC-ADD ON - Abnormal; Notable for the following:    Squamous Epithelial / LPF 0-5 (*)    Bacteria, UA FEW (*)    All other components within normal limits  PREGNANCY, URINE  GC/CHLAMYDIA PROBE AMP ([place]) NOT AT [facility]    EKG  EKG Interpretation None       Radiology No results found.  Procedures Procedures (including critical care time)  Medications Ordered in ED Medications  metoCLOPramide (REGLAN) injection 10 mg (10 mg Intravenous Given 01/21/16 1247)  ketorolac (TORADOL) 30 MG/ML injection 30 mg (30 mg Intravenous Given 01/21/16 1247)  dexamethasone (DECADRON) injection 10 mg (10 mg Intravenous Given 01/21/16 1247)     Initial Impression / Assessment and  Plan / ED Course  I have reviewed the triage vital signs and the nursing notes.  Pertinent labs & imaging results that were available during my care of the patient were reviewed by me and considered in my medical decision making (see chart for details).  Clinical Course   Pt with typical migraine HA without sx suggestive of SAH(sudden onset, worst of life, or deficits), infection, or cavernous vein thrombosis.  Normal neuro exam and vital signs. Will give HA cocktail and on re-eval pt feeling much better.  Pt's migraine probably hormone related.  Secondly patient has had ongoing vaginal bleeding for the last 1 month with IUD present. IUD has been present for approximately 5 years and patient is requesting that it be removed. She has not yet made follow-up with OB/GYN. She denies any weakness, chest  pain or shortness of breath but states she has been using 4-6 pads daily for the last month due to bleeding. She also is requesting testing for STDs. On exam patient has no abdominal pain minimal vaginal bleeding and no cervical motion tenderness. Wet prep positive for clue cells but no other significant findings. This time patient chooses to wait for culture results before getting treated as there is a low suspicion for STD.  Also patient given Provera if bleeding does not stop and follow-up with OB/GYN   Final Clinical Impressions(s) / ED Diagnoses   Final diagnoses:  Intractable menstrual migraine with status migrainosus  DUB (dysfunctional uterine bleeding)    New Prescriptions New Prescriptions   MEDROXYPROGESTERONE (PROVERA) 5 MG TABLET    Take 1 tablet (5 mg total) by mouth daily.     Gwyneth SproutWhitney Drina Jobst, MD 01/21/16 [phone]

## 2016-01-22 LAB — GC/CHLAMYDIA PROBE AMP (~~LOC~~) NOT AT ARMC
CHLAMYDIA, DNA PROBE: NEGATIVE
Neisseria Gonorrhea: NEGATIVE

## 2016-09-08 ENCOUNTER — Emergency Department (HOSPITAL_BASED_OUTPATIENT_CLINIC_OR_DEPARTMENT_OTHER)
Admission: EM | Admit: 2016-09-08 | Discharge: 2016-09-08 | Disposition: A | Discharge status: Home / Self Care | Payer: Medicaid Other | Attending: Emergency Medicine | Admitting: Emergency Medicine

## 2016-09-08 ENCOUNTER — Encounter (HOSPITAL_BASED_OUTPATIENT_CLINIC_OR_DEPARTMENT_OTHER): Payer: Self-pay | Admitting: *Deleted

## 2016-09-08 DIAGNOSIS — G43829 Menstrual migraine, not intractable, without status migrainosus: Secondary | ICD-10-CM

## 2016-09-08 DIAGNOSIS — F17200 Nicotine dependence, unspecified, uncomplicated: Secondary | ICD-10-CM | POA: Insufficient documentation

## 2016-09-08 MED ORDER — SODIUM CHLORIDE 0.9 % IV BOLUS (SEPSIS)
1000.0000 mL | Freq: Once | INTRAVENOUS | Status: AC
  Administered 2016-09-08: 1000 mL via INTRAVENOUS

## 2016-09-08 MED ORDER — KETOROLAC TROMETHAMINE 30 MG/ML IJ SOLN
30.0000 mg | Freq: Once | INTRAMUSCULAR | Status: AC
  Administered 2016-09-08: 30 mg via INTRAVENOUS
  Filled 2016-09-08: qty 1

## 2016-09-08 MED ORDER — PROCHLORPERAZINE EDISYLATE 5 MG/ML IJ SOLN
10.0000 mg | Freq: Once | INTRAMUSCULAR | Status: AC
  Administered 2016-09-08: 10 mg via INTRAVENOUS
  Filled 2016-09-08: qty 2

## 2016-09-08 MED ORDER — DIPHENHYDRAMINE HCL 50 MG/ML IJ SOLN
25.0000 mg | Freq: Once | INTRAMUSCULAR | Status: AC
  Administered 2016-09-08: 25 mg via INTRAVENOUS
  Filled 2016-09-08: qty 1

## 2016-09-08 NOTE — ED Provider Notes (Signed)
[facility]   CSN: [id number] Arrival date & time: 09/08/16  1143     History   Chief Complaint Chief Complaint  Patient presents with  . Migraine    HPI Dawn Heath is a 31 y.o. female.  HPI   Pt with hx headaches p/w left temporal headache with sensitivity to light and sound, nausea that began 3 days ago.  Has been taking Excedrin and tylenol without significant improvement.  LMP was last week, was one week early and longer than usual.  She does usually get headaches like this around her period.  Denies fevers, recent illness.    History reviewed. No pertinent past medical history.  There are no active problems to display for this patient.   History reviewed. No pertinent surgical history.  OB History    Gravida Para Term Preterm AB Living   1             SAB TAB Ectopic Multiple Live Births                   Home Medications    Prior to Admission medications   Medication Sig Start Date End Date Taking? Authorizing Provider  medroxyPROGESTERone (PROVERA) 5 MG tablet Take 1 tablet (5 mg total) by mouth daily. 01/21/16   Gwyneth Sprout, MD    Family History No family history on file.  Social History Social History  Substance Use Topics  . Smoking status: Current Every Day Smoker  . Smokeless tobacco: Never Used  . Alcohol use Yes     Comment: social     Allergies   Patient has no known allergies.   Review of Systems Review of Systems  All other systems reviewed and are negative.    Physical Exam Updated Vital Signs BP 121/79 (BP Location: Left Arm)   Pulse 68   Temp 99.4 F (37.4 C) (Oral)   Resp 16   Ht  (1.6 m)   Wt 111.1 kg   LMP 09/01/2016   SpO2 100%   BMI 43.40 kg/m   Physical Exam  Constitutional: She appears well-developed and well-nourished. No distress.  HENT:  Head: Normocephalic and atraumatic.  Neck: Neck supple.  Cardiovascular: Normal rate and regular rhythm.   Pulmonary/Chest:  Effort normal and breath sounds normal. No respiratory distress. She has no wheezes. She has no rales.  Neurological: She is alert.  CN II-XII intact, EOMs intact, no pronator drift, grip strengths equal bilaterally; strength 5/5 in all extremities, sensation intact in all extremities; finger to nose, heel to shin, rapid alternating movements normal.     Skin: She is not diaphoretic.  Nursing note and vitals reviewed.    ED Treatments / Results  Labs (all labs ordered are listed, but only abnormal results are displayed) Labs Reviewed - No data to display  EKG  EKG Interpretation None       Radiology No results found.  Procedures Procedures (including critical care time)  Medications Ordered in ED Medications  sodium chloride 0.9 % bolus 1,000 mL (0 mLs Intravenous Stopped 09/08/16 1418)  ketorolac (TORADOL) 30 MG/ML injection 30 mg (30 mg Intravenous Given 09/08/16 1239)  prochlorperazine (COMPAZINE) injection 10 mg (10 mg Intravenous Given 09/08/16 1238)  diphenhydrAMINE (BENADRYL) injection 25 mg (25 mg Intravenous Given 09/08/16 1240)     Initial Impression / Assessment and Plan / ED Course  I have reviewed the triage vital signs and the nursing notes.  Pertinent labs & imaging results  that were available during my care of the patient were reviewed by me and considered in my medical decision making (see chart for details).  Clinical Course as of Sep 09 1435  Mon Sep 08, 2016  1404 Pt reports headache is relieved.    [EW]    Clinical Course User Index [EW] Trixie Dredge, PA-C    Afebrile, nontoxic patient with typical migraine  headache.  No red flags including head trauma, fevers, meningeal signs, focal neurologic deficits.  Migraine cocktail  given with relief.    D/C home with PCP follow up.  Discussed result, findings, treatment, and follow up  with patient.  Pt given return precautions.  Pt verbalizes understanding and agrees with plan.     Final Clinical  Impressions(s) / ED Diagnoses   Final diagnoses:  Menstrual migraine without status migrainosus, not intractable    New Prescriptions Discharge Medication List as of 09/08/2016  2:05 PM       Trixie Dredge, PA-C 09/08/16 1438    Doug Sou, MD 09/08/16 1541

## 2016-09-08 NOTE — ED Notes (Signed)
ED Provider at bedside for assessment.  

## 2016-09-08 NOTE — ED Notes (Signed)
Pt unable to give UA at this time 

## 2016-09-08 NOTE — Discharge Instructions (Signed)
Read the information below.  You may return to the Emergency Department at any time for worsening condition or any new symptoms that concern you.  ° °You are having a headache. No specific cause was found today for your headache. It may have been a migraine or other cause of headache. Stress, anxiety, fatigue, and depression are common triggers for headaches. Your headache today does not appear to be life-threatening or require hospitalization, but often the exact cause of headaches is not determined in the emergency department. Therefore, follow-up with your doctor is very important to find out what may have caused your headache, and whether or not you need any further diagnostic testing or treatment. Sometimes headaches can appear benign (not harmful), but then more serious symptoms can develop which should prompt an immediate re-evaluation by your doctor or the emergency department. °SEEK MEDICAL ATTENTION IF: °You develop possible problems with medications prescribed.  °The medications don't resolve your headache, if it recurs , or if you have multiple episodes of vomiting or can't take fluids. °You have a change from the usual headache. °RETURN IMMEDIATELY IF you develop a sudden, severe headache or confusion, become poorly responsive or faint, develop a fever above 100.4F or problem breathing, have a change in speech, vision, swallowing, or understanding, or develop new weakness, numbness, tingling, incoordination, or have a seizure.  °

## 2016-09-08 NOTE — ED Triage Notes (Signed)
Headache for a week. No relief with OTC headache relief.

## 2017-03-17 ENCOUNTER — Encounter (HOSPITAL_BASED_OUTPATIENT_CLINIC_OR_DEPARTMENT_OTHER): Payer: Self-pay | Admitting: *Deleted

## 2017-03-17 ENCOUNTER — Emergency Department (HOSPITAL_BASED_OUTPATIENT_CLINIC_OR_DEPARTMENT_OTHER)
Admission: EM | Admit: 2017-03-17 | Discharge: 2017-03-17 | Disposition: A | Discharge status: Home / Self Care | Payer: Self-pay | Attending: Emergency Medicine | Admitting: Emergency Medicine

## 2017-03-17 DIAGNOSIS — N762 Acute vulvitis: Secondary | ICD-10-CM

## 2017-03-17 DIAGNOSIS — Z79899 Other long term (current) drug therapy: Secondary | ICD-10-CM | POA: Insufficient documentation

## 2017-03-17 DIAGNOSIS — N764 Abscess of vulva: Secondary | ICD-10-CM | POA: Insufficient documentation

## 2017-03-17 DIAGNOSIS — Z87891 Personal history of nicotine dependence: Secondary | ICD-10-CM | POA: Insufficient documentation

## 2017-03-17 MED ORDER — CLINDAMYCIN HCL 150 MG PO CAPS: 300.0000 mg | ORAL_CAPSULE | Freq: Four times a day (QID) | ORAL | 0 refills | Status: DC

## 2017-03-17 MED ORDER — TRAMADOL HCL 50 MG PO TABS: 50.0000 mg | ORAL_TABLET | Freq: Four times a day (QID) | ORAL | 0 refills | Status: DC | PRN

## 2017-03-17 MED FILL — traMADol HCL 50 MG TABS: 50 | 3 days supply | Qty: 15 | Fill #0

## 2017-03-17 MED FILL — CLINDAMYCIN HCL 150 MG CAPS: 150 | 5 days supply | Qty: 42 | Fill #0

## 2017-03-17 NOTE — ED Provider Notes (Signed)
[facility] EMERGENCY DEPARTMENT Provider Note   CSN: [id number] Arrival date & time: 03/17/17  [id number]     History   Chief Complaint Chief Complaint  Patient presents with  . Abscess    HPI Dawn Heath is a 31 y.o. female.  HPI Dawn Heath is a 31 y.o. female presents to ED with complaint of swelling to left labia. Symptoms began 2 days ago. Pain is worsened with movement. Symptoms worsening. Pain is burning 7/10. Denies fever, chills. No abdominal pain. No rash anywhere else to the body. Denies vaginal discharge, no vaginal bleeding. No hx of the same. Took tylenol with no relief.   History reviewed. No pertinent past medical history.  There are no active problems to display for this patient.   Past Surgical History:  Procedure Laterality Date  . MOUTH SURGERY      OB History    Gravida Para Term Preterm AB Living   1             SAB TAB Ectopic Multiple Live Births                   Home Medications    Prior to Admission medications   Medication Sig Start Date End Date Taking? Authorizing Provider  medroxyPROGESTERone (PROVERA) 5 MG tablet Take 1 tablet (5 mg total) by mouth daily. 01/21/16   Gwyneth SproutPlunkett, Whitney, MD    Family History No family history on file.  Social History Social History  Substance Use Topics  . Smoking status: Former Smoker    Quit date: 03/17/2016  . Smokeless tobacco: Never Used  . Alcohol use Yes     Comment: social     Allergies   Patient has no known allergies.   Review of Systems Review of Systems  Constitutional: Negative for chills and fever.  Gastrointestinal: Negative for abdominal pain.  Genitourinary: Positive for vaginal pain. Negative for dysuria, pelvic pain, vaginal bleeding and vaginal discharge.  Skin: Positive for wound.  Neurological: Negative for headaches.  All other systems reviewed and are negative.    Physical Exam Updated Vital Signs BP 135/83 (BP Location: Right Arm)   Pulse 77    Temp 98.6 F (37 C) (Oral)   Resp 18   Ht 5\' 4"  (1.626 m)   Wt 117.9 kg (260 lb)   LMP 02/23/2017   SpO2 100%   Breastfeeding? No   BMI 44.63 kg/m   Physical Exam  Constitutional: She is oriented to person, place, and time. She appears well-developed and well-nourished. No distress.  Eyes: Conjunctivae are normal.  Neck: Neck supple.  Abdominal: There is no tenderness.  Genitourinary:  Genitourinary Comments: Mild swelling to entire left labia majora.  Labia minora is normal.  No fluctuance or induration over the site of the Bartholin's gland, there is no palpable fluctuance over the labia.  Swelling and tenderness extends into the most proximal thigh.  No skin induration, no fluctuance.  No drainage.  No skin lesions.  Neurological: She is alert and oriented to person, place, and time.  Skin: Skin is warm and dry.  Nursing note and vitals reviewed.    ED Treatments / Results  Labs (all labs ordered are listed, but only abnormal results are displayed) Labs Reviewed - No data to display  EKG  EKG Interpretation None       Radiology No results found.  Procedures Procedures (including critical care time)  Medications Ordered in ED Medications - No data to display  Initial Impression / Assessment and Plan / ED Course  I have reviewed the triage vital signs and the nursing notes.  Pertinent labs & imaging results that were available during my care of the patient were reviewed by me and considered in my medical decision making (see chart for details).     Patient with painful swelling to the left labia majora with no significant skin induration, no palpable fluctuance over the labia.  Tenderness does extend into the proximal thigh.  There is no skin discoloration.  There is no drainage.  No concern for Fournier's gangrene.  I am concerned about possible Bartholin's gland, however at this time there is no significant fluctuance or swelling to that area.  I question  early abscess versus labia cellulitis.  We discussed starting on antibiotics and doing warm compresses every few hours for the next several days.  Discussed that if her symptoms get worse, if her swelling becomes greater, if she develops fever, chills, malaise, she should return to emergency department for possible drainage.  This time I do not think she needs any drainage, since there is no fluctuance or significant induration.  She is afebrile and nontoxic-appearing.  I will start her on clindamycin.  Tylenol and tramadol for pain.  Warm bath soaks at home.  Return precautions discussed.  Vitals:   03/17/17 0900  BP: 135/83  Pulse: 77  Resp: 18  Temp: 98.6 F (37 C)  TempSrc: Oral  SpO2: 100%  Weight: 117.9 kg (260 lb)  Height: 5\' 4"  (1.626 m)     Final Clinical Impressions(s) / ED Diagnoses   Final diagnoses:  Cellulitis of labia majora    New Prescriptions New Prescriptions   No medications on file     Jaynie Crumble, PA-C 03/17/17 1541    Alvira Monday, MD 03/19/17 1302

## 2017-03-17 NOTE — Discharge Instructions (Signed)
I think your pain and swelling may be due to a developing bartholin cyst/abscess, see information below. At this time there is no drainable area. Take antibiotics as prescribed until all gone. Warm compresses or soaks every few hours. Follow up as needed. Return if worsening.

## 2017-03-17 NOTE — ED Triage Notes (Signed)
Pt reports boil to vagina (left labia) since sunday

## 2018-05-07 ENCOUNTER — Encounter (HOSPITAL_BASED_OUTPATIENT_CLINIC_OR_DEPARTMENT_OTHER): Payer: Self-pay | Admitting: Adult Health

## 2018-05-07 ENCOUNTER — Other Ambulatory Visit: Payer: Self-pay

## 2018-05-07 ENCOUNTER — Emergency Department (HOSPITAL_BASED_OUTPATIENT_CLINIC_OR_DEPARTMENT_OTHER)
Admission: EM | Admit: 2018-05-07 | Discharge: 2018-05-07 | Disposition: A | Discharge status: Home / Self Care | Payer: BLUE CROSS/BLUE SHIELD | Attending: Emergency Medicine | Admitting: Emergency Medicine

## 2018-05-07 ENCOUNTER — Emergency Department (HOSPITAL_BASED_OUTPATIENT_CLINIC_OR_DEPARTMENT_OTHER): Payer: BLUE CROSS/BLUE SHIELD

## 2018-05-07 DIAGNOSIS — J40 Bronchitis, not specified as acute or chronic: Secondary | ICD-10-CM | POA: Diagnosis not present

## 2018-05-07 DIAGNOSIS — Z87891 Personal history of nicotine dependence: Secondary | ICD-10-CM | POA: Diagnosis not present

## 2018-05-07 DIAGNOSIS — Z79899 Other long term (current) drug therapy: Secondary | ICD-10-CM | POA: Diagnosis not present

## 2018-05-07 DIAGNOSIS — R0602 Shortness of breath: Secondary | ICD-10-CM | POA: Diagnosis present

## 2018-05-07 MED ORDER — ALBUTEROL SULFATE HFA 108 (90 BASE) MCG/ACT IN AERS
2.0000 | INHALATION_SPRAY | Freq: Once | RESPIRATORY_TRACT | Status: AC
  Administered 2018-05-07: 2 via RESPIRATORY_TRACT
  Filled 2018-05-07: qty 6.7

## 2018-05-07 MED ORDER — HYDROCODONE-HOMATROPINE 5-1.5 MG/5ML PO SYRP: 5.0000 mL | ORAL_SOLUTION | Freq: Four times a day (QID) | ORAL | 0 refills | Status: DC | PRN

## 2018-05-07 NOTE — ED Triage Notes (Signed)
PREsents with cough and SOB that began today while at work and after having a coughing episode. She denies fevers. Endorses productive cough and dizziness. Denies diarrhea and nausea.

## 2018-05-07 NOTE — ED Provider Notes (Signed)
[facility] EMERGENCY DEPARTMENT Provider Note   CSN: [id number] Arrival date & time: 05/07/18  1405     History   Chief Complaint Chief Complaint  Patient presents with  . Shortness of Breath    HPI Dawn Heath is a 32 y.o. female.  Patient is a 32 year old female with no significant past medical history who presents with cough and shortness of breath.  She states she had bronchitis about a year ago and is currently having similar symptoms.  She has some nasal congestion and coughing which mostly started today.  She got a little short of breath today after a coughing episode.  She has had no vomiting.  No fevers.  She has a little soreness to the middle of her chest in her throat.  No leg pain or swelling.  She has not taken any medications today for the symptoms.     History reviewed. No pertinent past medical history.  There are no active problems to display for this patient.   Past Surgical History:  Procedure Laterality Date  . MOUTH SURGERY       OB History    Gravida  1   Para      Term      Preterm      AB      Living        SAB      TAB      Ectopic      Multiple      Live Births               Home Medications    Prior to Admission medications   Medication Sig Start Date End Date Taking? Authorizing Provider  clindamycin (CLEOCIN) 150 MG capsule Take 2 capsules (300 mg total) by mouth every 6 (six) hours. 03/17/17   Kirichenko, Lemont Fillersatyana, PA-C  medroxyPROGESTERone (PROVERA) 5 MG tablet Take 1 tablet (5 mg total) by mouth daily. 01/21/16   Gwyneth SproutPlunkett, Whitney, MD  traMADol (ULTRAM) 50 MG tablet Take 1 tablet (50 mg total) by mouth every 6 (six) hours as needed. 03/17/17   Jaynie CrumbleKirichenko, Tatyana, PA-C    Family History History reviewed. No pertinent family history.  Social History Social History   Tobacco Use  . Smoking status: Former Smoker    Last attempt to quit: 03/17/2016    Years since quitting: 2.1  . Smokeless  tobacco: Never Used  Substance Use Topics  . Alcohol use: Yes    Comment: social  . Drug use: No     Allergies   Patient has no known allergies.   Review of Systems Review of Systems  Constitutional: Negative for chills, diaphoresis, fatigue and fever.  HENT: Negative for congestion, rhinorrhea and sneezing.   Eyes: Negative.   Respiratory: Positive for cough, shortness of breath and wheezing. Negative for chest tightness.   Cardiovascular: Positive for chest pain. Negative for leg swelling.  Gastrointestinal: Negative for abdominal pain, blood in stool, diarrhea, nausea and vomiting.  Genitourinary: Negative for difficulty urinating, flank pain, frequency and hematuria.  Musculoskeletal: Negative for arthralgias and back pain.  Skin: Negative for rash.  Neurological: Negative for dizziness, speech difficulty, weakness, numbness and headaches.     Physical Exam Updated Vital Signs BP 122/72   Pulse 88   Temp 98.6 F (37 C) (Oral)   Resp (!) 22   Ht 5\' 3"  (1.6 m)   Wt 105.7 kg   LMP 04/14/2018 (Approximate)   SpO2 100%   BMI 41.27  kg/m   Physical Exam Constitutional:      Appearance: She is well-developed.  HENT:     Head: Normocephalic and atraumatic.     Right Ear: Tympanic membrane normal.     Left Ear: Tympanic membrane normal.     Mouth/Throat:     Mouth: Mucous membranes are moist.     Pharynx: Uvula midline. No pharyngeal swelling or oropharyngeal exudate.  Eyes:     Pupils: Pupils are equal, round, and reactive to light.  Neck:     Musculoskeletal: Normal range of motion and neck supple.  Cardiovascular:     Rate and Rhythm: Normal rate and regular rhythm.     Heart sounds: Normal heart sounds.  Pulmonary:     Effort: Pulmonary effort is normal. No respiratory distress.     Breath sounds: Normal breath sounds. No wheezing or rales.  Chest:     Chest wall: Tenderness (Soreness on palpation over the sternum, no crepitus or deformity) present.    Abdominal:     General: Bowel sounds are normal.     Palpations: Abdomen is soft.     Tenderness: There is no abdominal tenderness. There is no guarding or rebound.  Musculoskeletal: Normal range of motion.     Comments: No edema or calf tenderness  Lymphadenopathy:     Cervical: No cervical adenopathy.  Skin:    General: Skin is warm and dry.     Findings: No rash.  Neurological:     Mental Status: She is alert and oriented to person, place, and time.      ED Treatments / Results  Labs (all labs ordered are listed, but only abnormal results are displayed) Labs Reviewed - No data to display  EKG EKG Interpretation  Date/Time:  Friday May 07 2018 14:27:48 EST Ventricular Rate:  91 PR Interval:  144 QRS Duration: 78 QT Interval:  352 QTC Calculation: 432 R Axis:   64 Text Interpretation:  Normal sinus rhythm Possible Left atrial enlargement Borderline ECG No old tracing to compare Confirmed by Rolan BuccoBelfi, Tujuana Kilmartin [phone]) on 05/07/2018 3:22:09 PM   Radiology Dg Chest 2 View  Result Date: 05/07/2018 CLINICAL DATA:  Cough and shortness of breath for 1 day. EXAM: CHEST - 2 VIEW COMPARISON:  Thoracic spine radiographs dated 09/10/2015. FINDINGS: Normal sized heart. Clear lungs. Mild peribronchial thickening. Unremarkable bones. IMPRESSION: Mild bronchitic changes. Electronically Signed   By: Beckie SaltsSteven  Reid M.D.   On: 05/07/2018 14:42    Procedures Procedures (including critical care time)  Medications Ordered in ED Medications  albuterol (PROVENTIL HFA;VENTOLIN HFA) 108 (90 Base) MCG/ACT inhaler 2 puff (has no administration in time range)     Initial Impression / Assessment and Plan / ED Course  I have reviewed the triage vital signs and the nursing notes.  Pertinent labs & imaging results that were available during my care of the patient were reviewed by me and considered in my medical decision making (see chart for details).     Patient is a 32 year old female  who presents with URI symptoms associated with a wheezy cough.  Her lungs sound fairly clear on my exam.  She does not have any increased work of breathing.  She has no hypoxia.  Her oxygen saturation is 100% on room air with a heart rate of 88.  Chest x-ray is clear without evidence of pneumonia.  There is some changes consistent with bronchitis.  She does not have a fever or other symptoms that would be more  concerning for influenza.  She was discharged home in good condition.  She was given a prescription for Hycodan cough syrup.  She was dispensed an albuterol inhaler.  She was encouraged to follow-up with a primary care provider if her symptoms are not improving or return here as needed for any worsening symptoms.  Final Clinical Impressions(s) / ED Diagnoses   Final diagnoses:  Bronchitis    ED Discharge Orders    None       Rolan Bucco, MD 05/07/18 1526

## 2018-05-07 NOTE — ED Notes (Signed)
Nurse first-pt NAD- O2 sat 100% HR 97 RR 24-pt with congested sounding cough noted-given mask

## 2018-05-10 ENCOUNTER — Other Ambulatory Visit: Payer: Self-pay

## 2018-05-10 ENCOUNTER — Emergency Department (HOSPITAL_BASED_OUTPATIENT_CLINIC_OR_DEPARTMENT_OTHER)
Admission: EM | Admit: 2018-05-10 | Discharge: 2018-05-10 | Disposition: A | Discharge status: Home / Self Care | Payer: BLUE CROSS/BLUE SHIELD | Attending: Emergency Medicine | Admitting: Emergency Medicine

## 2018-05-10 ENCOUNTER — Encounter (HOSPITAL_BASED_OUTPATIENT_CLINIC_OR_DEPARTMENT_OTHER): Payer: Self-pay

## 2018-05-10 ENCOUNTER — Emergency Department (HOSPITAL_BASED_OUTPATIENT_CLINIC_OR_DEPARTMENT_OTHER): Payer: BLUE CROSS/BLUE SHIELD

## 2018-05-10 DIAGNOSIS — J111 Influenza due to unidentified influenza virus with other respiratory manifestations: Secondary | ICD-10-CM | POA: Diagnosis not present

## 2018-05-10 DIAGNOSIS — Z79899 Other long term (current) drug therapy: Secondary | ICD-10-CM | POA: Diagnosis not present

## 2018-05-10 DIAGNOSIS — R05 Cough: Secondary | ICD-10-CM

## 2018-05-10 DIAGNOSIS — R059 Cough, unspecified: Secondary | ICD-10-CM

## 2018-05-10 DIAGNOSIS — R69 Illness, unspecified: Secondary | ICD-10-CM

## 2018-05-10 DIAGNOSIS — Z87891 Personal history of nicotine dependence: Secondary | ICD-10-CM | POA: Diagnosis not present

## 2018-05-10 MED ORDER — ACETAMINOPHEN 500 MG PO TABS
1000.0000 mg | ORAL_TABLET | Freq: Once | ORAL | Status: AC
  Administered 2018-05-10: 1000 mg via ORAL
  Filled 2018-05-10: qty 2

## 2018-05-10 NOTE — ED Provider Notes (Signed)
[facility] EMERGENCY DEPARTMENT Provider Note   CSN: [id number] Arrival date & time: 05/10/18  1154     History   Chief Complaint Chief Complaint  Patient presents with  . Shortness of Breath    HPI Jolaine ArtistShatera Korman is a 32 y.o. female who presents with a 4-day history cough and now 2 day history of fever, body aches, headache, intermittent lightheadedness.  Patient denies any sore throat or ear pain.  She reports a few episodes of intermittent posttussive emesis, but denies any abdominal pain or diarrhea.  She denies any urinary symptoms.  She has been taking TheraFlu and DayQuil at home without significant relief.  HPI  History reviewed. No pertinent past medical history.  There are no active problems to display for this patient.   Past Surgical History:  Procedure Laterality Date  . MOUTH SURGERY       OB History    Gravida  1   Para      Term      Preterm      AB      Living        SAB      TAB      Ectopic      Multiple      Live Births               Home Medications    Prior to Admission medications   Medication Sig Start Date End Date Taking? Authorizing Provider  clindamycin (CLEOCIN) 150 MG capsule Take 2 capsules (300 mg total) by mouth every 6 (six) hours. 03/17/17   Kirichenko, Tatyana, PA-C  HYDROcodone-homatropine (HYCODAN) 5-1.5 MG/5ML syrup Take 5 mLs by mouth every 6 (six) hours as needed for cough. 05/07/18   Rolan BuccoBelfi, Melanie, MD  medroxyPROGESTERone (PROVERA) 5 MG tablet Take 1 tablet (5 mg total) by mouth daily. 01/21/16   Gwyneth SproutPlunkett, Whitney, MD  traMADol (ULTRAM) 50 MG tablet Take 1 tablet (50 mg total) by mouth every 6 (six) hours as needed. 03/17/17   Jaynie CrumbleKirichenko, Tatyana, PA-C    Family History No family history on file.  Social History Social History   Tobacco Use  . Smoking status: Former Smoker    Last attempt to quit: 03/17/2016    Years since quitting: 2.1  . Smokeless tobacco: Never Used  Substance Use  Topics  . Alcohol use: Yes    Comment: social  . Drug use: No     Allergies   Patient has no known allergies.   Review of Systems Review of Systems  Constitutional: Positive for fever. Negative for chills.  HENT: Negative for facial swelling and sore throat.   Respiratory: Positive for cough and shortness of breath.   Cardiovascular: Negative for chest pain.  Gastrointestinal: Negative for abdominal pain, nausea and vomiting.  Genitourinary: Negative for dysuria.  Musculoskeletal: Positive for myalgias. Negative for back pain.  Skin: Negative for rash and wound.  Neurological: Positive for light-headedness and headaches.  Psychiatric/Behavioral: The patient is not nervous/anxious.      Physical Exam Updated Vital Signs BP 111/73   Pulse 87   Temp (!) 101 F (38.3 C) (Oral)   Resp 18   LMP 05/08/2018   SpO2 97%   Physical Exam Vitals signs and nursing note reviewed.  Constitutional:      General: She is not in acute distress.    Appearance: She is well-developed. She is not diaphoretic.  HENT:     Head: Normocephalic and atraumatic.  Mouth/Throat:     Pharynx: No oropharyngeal exudate.  Eyes:     General: No scleral icterus.       Right eye: No discharge.        Left eye: No discharge.     Conjunctiva/sclera: Conjunctivae normal.     Pupils: Pupils are equal, round, and reactive to light.  Neck:     Musculoskeletal: Normal range of motion and neck supple.     Thyroid: No thyromegaly.  Cardiovascular:     Rate and Rhythm: Regular rhythm. Tachycardia present.     Heart sounds: Normal heart sounds. No murmur. No friction rub. No gallop.   Pulmonary:     Effort: Pulmonary effort is normal. No respiratory distress.     Breath sounds: Normal breath sounds. No stridor. No decreased breath sounds, wheezing or rales.  Abdominal:     General: Bowel sounds are normal. There is no distension.     Palpations: Abdomen is soft.     Tenderness: There is no abdominal  tenderness. There is no guarding or rebound.  Lymphadenopathy:     Cervical: No cervical adenopathy.  Skin:    General: Skin is warm and dry.     Coloration: Skin is not pale.     Findings: No rash.  Neurological:     Mental Status: She is alert.     Coordination: Coordination normal.      ED Treatments / Results  Labs (all labs ordered are listed, but only abnormal results are displayed) Labs Reviewed - No data to display  EKG None  Radiology Dg Chest 2 View  Result Date: 05/10/2018 CLINICAL DATA:  Cough and shortness of breath; chest pain EXAM: CHEST - 2 VIEW COMPARISON:  May 07, 2018. FINDINGS: No edema or consolidation. Heart size and pulmonary vascularity are normal. No adenopathy. No bone lesions. IMPRESSION: No edema or consolidation. Electronically Signed   By: Bretta BangWilliam  Woodruff III M.D.   On: 05/10/2018 13:25    Procedures Procedures (including critical care time)  Medications Ordered in ED Medications  acetaminophen (TYLENOL) tablet 1,000 mg (1,000 mg Oral Given 05/10/18 1323)     Initial Impression / Assessment and Plan / ED Course  I have reviewed the triage vital signs and the nursing notes.  Pertinent labs & imaging results that were available during my care of the patient were reviewed by me and considered in my medical decision making (see chart for details).     Patient presenting with influenza-like symptoms after being diagnosed with bronchitis few days ago.  Patient initially did not have a fever.  She reports her cough seems to be improving, but now she has a fever, body aches, and headache.  She has chest pain only when she coughs.  Her chest x-ray is clear.  Patient is out of the window for Tamiflu.  We discussed supportive treatment including rest, fluids, ibuprofen, Tylenol.  Patient advised to fill the Hycodan she was given a few days ago, she has not filled that yet.  Follow-up to PCP as needed.  Return precautions discussed.  Patient  understands and agrees with plan.  Patient discharged in satisfactory condition.  Final Clinical Impressions(s) / ED Diagnoses   Final diagnoses:  Cough  Influenza-like illness    ED Discharge Orders    None       Emi HolesLaw, Shikita Vaillancourt M, PA-C 05/10/18 1810    Derwood KaplanNanavati, Ankit, MD 05/15/18 1008

## 2018-05-10 NOTE — ED Triage Notes (Signed)
C/o SOB, nonprod cough-dx with bronchitis 3 days ago-NAD-to triage in w/c

## 2018-05-10 NOTE — Discharge Instructions (Signed)
Begin taking the cough medicine given to you at your last visit.  Alternate ibuprofen and Tylenol as prescribed over-the-counter for your fever, headache, and body aches.  Make sure to drink plenty of fluids and get plenty of rest.  Please return to the emergency department if you develop any new or worsening symptoms.

## 2018-05-10 NOTE — ED Notes (Signed)
Pt asked for blanket and water. Explained she does have a fever and npo until RN/dr  sees her

## 2018-06-16 ENCOUNTER — Encounter (HOSPITAL_BASED_OUTPATIENT_CLINIC_OR_DEPARTMENT_OTHER): Payer: Self-pay | Admitting: Emergency Medicine

## 2018-06-16 ENCOUNTER — Other Ambulatory Visit: Payer: Self-pay

## 2018-06-16 ENCOUNTER — Emergency Department (HOSPITAL_BASED_OUTPATIENT_CLINIC_OR_DEPARTMENT_OTHER)
Admission: EM | Admit: 2018-06-16 | Discharge: 2018-06-16 | Disposition: A | Discharge status: Home / Self Care | Payer: BLUE CROSS/BLUE SHIELD | Attending: Emergency Medicine | Admitting: Emergency Medicine

## 2018-06-16 DIAGNOSIS — J029 Acute pharyngitis, unspecified: Secondary | ICD-10-CM | POA: Diagnosis not present

## 2018-06-16 DIAGNOSIS — Z79899 Other long term (current) drug therapy: Secondary | ICD-10-CM | POA: Diagnosis not present

## 2018-06-16 DIAGNOSIS — Z87891 Personal history of nicotine dependence: Secondary | ICD-10-CM | POA: Diagnosis not present

## 2018-06-16 DIAGNOSIS — H9201 Otalgia, right ear: Secondary | ICD-10-CM | POA: Diagnosis not present

## 2018-06-16 DIAGNOSIS — R070 Pain in throat: Secondary | ICD-10-CM | POA: Diagnosis present

## 2018-06-16 LAB — GROUP A STREP BY PCR: Group A Strep by PCR: NOT DETECTED

## 2018-06-16 MED ORDER — LIDOCAINE VISCOUS HCL 2 % MT SOLN: 15.0000 mL | OROMUCOSAL | 0 refills | Status: DC | PRN

## 2018-06-16 MED ORDER — FLUTICASONE PROPIONATE 50 MCG/ACT NA SUSP: 1.0000 | Freq: Every day | NASAL | 2 refills | Status: DC

## 2018-06-16 MED FILL — LIDOCAINE 2% VISCOUS SOLN: 2 | 3 days supply | Qty: 100 | Fill #0

## 2018-06-16 MED FILL — FLUTICASONE PROP 50 MCG SPR: 50 | 60 days supply | Qty: 16 | Fill #0

## 2018-06-16 NOTE — ED Triage Notes (Signed)
Pt here with sore throat and right ear pain that began this morning.

## 2018-06-16 NOTE — ED Notes (Addendum)
Pt states she thinks she has strep even though her test was negative.  She thinks it was too early to detect.  Pt wants to see EDP

## 2018-06-16 NOTE — ED Provider Notes (Signed)
[facility] EMERGENCY DEPARTMENT Provider Note   CSN: [id number] Arrival date & time: 06/16/18  0913     History   Chief Complaint Chief Complaint  Patient presents with  . Sore Throat  . Otalgia    HPI Dawn Heath is a 33 y.o. female who presents to ED for 12-hour history of right ear pain and sore throat.  No sick contacts with similar symptoms.  She recently had flu symptoms which have gradually resolved.  She took 1 dose of over-the-counter pain medication for sore throat last night but states that she woke up this morning feeling worse.  Denies any recent travel, trouble breathing, trouble swallowing, trismus, drooling or fever. She reports a lingering cough from her flulike illness last month.  HPI  History reviewed. No pertinent past medical history.  There are no active problems to display for this patient.   Past Surgical History:  Procedure Laterality Date  . MOUTH SURGERY       OB History    Gravida  1   Para      Term      Preterm      AB      Living        SAB      TAB      Ectopic      Multiple      Live Births               Home Medications    Prior to Admission medications   Medication Sig Start Date End Date Taking? Authorizing Provider  medroxyPROGESTERone (PROVERA) 5 MG tablet Take 1 tablet (5 mg total) by mouth daily. 01/21/16  Yes Gwyneth Sprout, MD  clindamycin (CLEOCIN) 150 MG capsule Take 2 capsules (300 mg total) by mouth every 6 (six) hours. 03/17/17   Kirichenko, Tatyana, PA-C  fluticasone (FLONASE) 50 MCG/ACT nasal spray Place 1 spray into both nostrils daily. 06/16/18   Celia Friedland, PA-C  HYDROcodone-homatropine (HYCODAN) 5-1.5 MG/5ML syrup Take 5 mLs by mouth every 6 (six) hours as needed for cough. 05/07/18   Rolan Bucco, MD  lidocaine (XYLOCAINE) 2 % solution Use as directed 15 mLs in the mouth or throat as needed for mouth pain. 06/16/18   Jozeph Persing, PA-C  traMADol (ULTRAM) 50 MG tablet Take 1  tablet (50 mg total) by mouth every 6 (six) hours as needed. 03/17/17   Jaynie Crumble, PA-C    Family History History reviewed. No pertinent family history.  Social History Social History   Tobacco Use  . Smoking status: Former Smoker    Last attempt to quit: 03/17/2016    Years since quitting: 2.2  . Smokeless tobacco: Never Used  Substance Use Topics  . Alcohol use: Yes    Comment: social  . Drug use: No     Allergies   Patient has no known allergies.   Review of Systems Review of Systems  Constitutional: Negative for chills and fever.  HENT: Positive for sore throat. Negative for congestion, sinus pressure, sinus pain, sneezing and trouble swallowing.   Respiratory: Negative for shortness of breath.      Physical Exam Updated Vital Signs BP 129/78 (BP Location: Right Arm)   Pulse 93   Temp 98.6 F (37 C) (Oral)   Resp 14   Ht 5\' 3"  (1.6 m)   Wt 101.2 kg   SpO2 100%   BMI 39.50 kg/m   Physical Exam Vitals signs and nursing note reviewed.  Constitutional:  General: She is not in acute distress.    Appearance: She is well-developed. She is not diaphoretic.  HENT:     Head: Normocephalic and atraumatic.     Right Ear: A middle ear effusion is present. Tympanic membrane is not erythematous or retracted.     Left Ear: A middle ear effusion is present. Tympanic membrane is not erythematous or retracted.     Mouth/Throat:     Pharynx: Oropharynx is clear. Uvula midline.     Tonsils: No tonsillar exudate or tonsillar abscesses. Swelling: 1+ on the right. 1+ on the left.     Comments: Patient does not appear to be in acute distress. No trismus or drooling present. No pooling of secretions. Patient is tolerating secretions and is not in respiratory distress. No neck pain or tenderness to palpation of the neck. Full active and passive range of motion of the neck. No evidence of RPA or PTA. Eyes:     General: No scleral icterus.    Conjunctiva/sclera:  Conjunctivae normal.  Neck:     Musculoskeletal: Normal range of motion.  Pulmonary:     Effort: Pulmonary effort is normal. No respiratory distress.  Skin:    Findings: No rash.  Neurological:     Mental Status: She is alert.      ED Treatments / Results  Labs (all labs ordered are listed, but only abnormal results are displayed) Labs Reviewed  GROUP A STREP BY PCR    EKG None  Radiology No results found.  Procedures Procedures (including critical care time)  Medications Ordered in ED Medications - No data to display   Initial Impression / Assessment and Plan / ED Course  I have reviewed the triage vital signs and the nursing notes.  Pertinent labs & imaging results that were available during my care of the patient were reviewed by me and considered in my medical decision making (see chart for details).     Pt rapid strep test negative. Pt is tolerating secretions, not in respiratory distress, no neck pain, no trismus. Presentation not concerning for peritonsillar abscess or spread of infection to deep spaces of the throat; patent airway. Pt will be discharged with lidocaine to swish and spit, Flonase for ear pain. Ibuprofen or Tylenol as needed for pain/fever. Specific return precautions discussed. Recommended PCP follow up. Pt appears safe for discharge.   Patient is hemodynamically stable, in NAD, and able to ambulate in the ED. Evaluation does not show pathology that would require ongoing emergent intervention or inpatient treatment. I explained the diagnosis to the patient. Pain has been managed and has no complaints prior to discharge. Patient is comfortable with above plan and is stable for discharge at this time. All questions were answered prior to disposition. Strict return precautions for returning to the ED were discussed. Encouraged follow up with PCP.    Portions of this note were generated with Scientist, clinical (histocompatibility and immunogenetics). Dictation errors may occur despite  best attempts at proofreading.  Final Clinical Impressions(s) / ED Diagnoses   Final diagnoses:  Viral pharyngitis    ED Discharge Orders         Ordered    lidocaine (XYLOCAINE) 2 % solution  As needed     06/16/18 1121    fluticasone (FLONASE) 50 MCG/ACT nasal spray  Daily     06/16/18 1121           Dietrich Pates, PA-C 06/16/18 1121    Tegeler, Canary Brim, MD 06/16/18 [phone]

## 2018-06-16 NOTE — Discharge Instructions (Addendum)
Your strep test was negative. Swish and spit lidocaine solution to help with throat discomfort.  Do not swallow. Flonase will help with your ear effusions, which is most likely causing your pain as there are no signs of infection. Return to ED for worsening symptoms, trouble breathing, trouble swallowing, trouble opening your mouth, chest pain.

## 2018-07-31 ENCOUNTER — Other Ambulatory Visit: Payer: Self-pay

## 2018-07-31 ENCOUNTER — Emergency Department (HOSPITAL_BASED_OUTPATIENT_CLINIC_OR_DEPARTMENT_OTHER)
Admission: EM | Admit: 2018-07-31 | Discharge: 2018-08-01 | Disposition: A | Discharge status: Home / Self Care | Payer: BLUE CROSS/BLUE SHIELD | Attending: Emergency Medicine | Admitting: Emergency Medicine

## 2018-07-31 ENCOUNTER — Encounter (HOSPITAL_BASED_OUTPATIENT_CLINIC_OR_DEPARTMENT_OTHER): Payer: Self-pay | Admitting: Emergency Medicine

## 2018-07-31 DIAGNOSIS — Z79899 Other long term (current) drug therapy: Secondary | ICD-10-CM | POA: Insufficient documentation

## 2018-07-31 DIAGNOSIS — Z87891 Personal history of nicotine dependence: Secondary | ICD-10-CM | POA: Diagnosis not present

## 2018-07-31 DIAGNOSIS — R1013 Epigastric pain: Secondary | ICD-10-CM | POA: Insufficient documentation

## 2018-07-31 MED ORDER — SODIUM CHLORIDE 0.9% FLUSH
3.0000 mL | Freq: Once | INTRAVENOUS | Status: DC
  Filled 2018-07-31: qty 3

## 2018-07-31 NOTE — ED Triage Notes (Signed)
Pt states abdominal pain for the past three days. States it is lower central abdomen. Denies n/v/d fevers, or chills.

## 2018-08-01 LAB — COMPREHENSIVE METABOLIC PANEL
ALT: 20 U/L (ref 0–44)
AST: 25 U/L (ref 15–41)
Albumin: 3.4 g/dL — ABNORMAL LOW (ref 3.5–5.0)
Alkaline Phosphatase: 93 U/L (ref 38–126)
Anion gap: 6 (ref 5–15)
BUN: 13 mg/dL (ref 6–20)
CHLORIDE: 106 mmol/L (ref 98–111)
CO2: 26 mmol/L (ref 22–32)
Calcium: 8.9 mg/dL (ref 8.9–10.3)
Creatinine, Ser: 0.96 mg/dL (ref 0.44–1.00)
GFR calc Af Amer: >60 mL/min (ref >60)
GFR calc non Af Amer: >60 mL/min (ref >60)
GLUCOSE: 85 mg/dL (ref 70–99)
POTASSIUM: 3.6 mmol/L (ref 3.5–5.1)
SODIUM: 138 mmol/L (ref 135–145)
Total Bilirubin: 0.3 mg/dL (ref 0.3–1.2)
Total Protein: 6.8 g/dL (ref 6.5–8.1)

## 2018-08-01 LAB — CBC
HEMATOCRIT: 33.3 % — AB (ref 36.0–46.0)
HEMOGLOBIN: 10 g/dL — AB (ref 12.0–15.0)
MCH: 26.8 pg (ref 26.0–34.0)
MCHC: 30 g/dL (ref 30.0–36.0)
MCV: 89.3 fL (ref 80.0–100.0)
Platelets: 298 10*3/uL (ref 150–400)
RBC: 3.73 MIL/uL — ABNORMAL LOW (ref 3.87–5.11)
RDW: 13.7 % (ref 11.5–15.5)
WBC: 5.7 10*3/uL (ref 4.0–10.5)
nRBC: 0 % (ref 0.0–0.2)

## 2018-08-01 LAB — PREGNANCY, URINE: Preg Test, Ur: NEGATIVE

## 2018-08-01 LAB — LIPASE, BLOOD: LIPASE: 26 U/L (ref 11–51)

## 2018-08-01 MED ORDER — ALUM & MAG HYDROXIDE-SIMETH 400-400-40 MG/5ML PO SUSP: 15.0000 mL | Freq: Four times a day (QID) | ORAL | 0 refills | Status: DC | PRN

## 2018-08-01 MED ORDER — ALUM & MAG HYDROXIDE-SIMETH 200-200-20 MG/5ML PO SUSP
30.0000 mL | Freq: Once | ORAL | Status: AC
  Administered 2018-08-01: 30 mL via ORAL
  Filled 2018-08-01: qty 30

## 2018-08-01 NOTE — Discharge Instructions (Signed)

## 2018-08-01 NOTE — ED Provider Notes (Signed)
[facility] EMERGENCY DEPARTMENT Provider Note  CSN: [id number] Arrival date & time: 07/31/18 2320  Chief Complaint(s) Abdominal Pain  HPI Dawn Heath is a 33 y.o. female who presents to the emergency department with 3 days of intermittent mitten epigastric abdominal discomfort.  Described as aching/cramping.  No alleviating or aggravating factors.  No associated fevers or chills.  No chest pain or shortness of breath.  Patient endorses nausea without emesis.  She did report that she was constipated earlier in the week and took milk of magnesia.  Has had a bowel movement since.  No diarrhea.  No urinary symptoms.  HPI  Past Medical History History reviewed. No pertinent past medical history. There are no active problems to display for this patient.  Home Medication(s) Prior to Admission medications   Medication Sig Start Date End Date Taking? Authorizing Provider  alum & mag hydroxide-simeth (MAALOX ADVANCED MAX ST) 400-400-40 MG/5ML suspension Take 15 mLs by mouth every 6 (six) hours as needed for indigestion. 08/01/18   , Amadeo Garnet, MD  clindamycin (CLEOCIN) 150 MG capsule Take 2 capsules (300 mg total) by mouth every 6 (six) hours. 03/17/17   Kirichenko, Tatyana, PA-C  fluticasone (FLONASE) 50 MCG/ACT nasal spray Place 1 spray into both nostrils daily. 06/16/18   Khatri, Hina, PA-C  HYDROcodone-homatropine (HYCODAN) 5-1.5 MG/5ML syrup Take 5 mLs by mouth every 6 (six) hours as needed for cough. 05/07/18   Rolan Bucco, MD  lidocaine (XYLOCAINE) 2 % solution Use as directed 15 mLs in the mouth or throat as needed for mouth pain. 06/16/18   Khatri, Hina, PA-C  medroxyPROGESTERone (PROVERA) 5 MG tablet Take 1 tablet (5 mg total) by mouth daily. 01/21/16   Gwyneth Sprout, MD  traMADol (ULTRAM) 50 MG tablet Take 1 tablet (50 mg total) by mouth every 6 (six) hours as needed. 03/17/17   Jaynie Crumble, PA-C                                                                          Past Surgical History Past Surgical History:  Procedure Laterality Date  . MOUTH SURGERY     Family History No family history on file.  Social History Social History   Tobacco Use  . Smoking status: Former Smoker    Last attempt to quit: 03/17/2016    Years since quitting: 2.3  . Smokeless tobacco: Never Used  Substance Use Topics  . Alcohol use: Yes    Comment: social  . Drug use: No   Allergies Patient has no known allergies.  Review of Systems Review of Systems All other systems are reviewed and are negative for acute change except as noted in the HPI  Physical Exam Vital Signs  I have reviewed the triage vital signs BP 130/81   Pulse 89   Temp 98 F (36.7 C) (Oral)   Resp 20   Ht 5\' 3"  (1.6 m)   Wt 96.2 kg   SpO2 100%   BMI 37.55 kg/m   Physical Exam Vitals signs reviewed.  Constitutional:      General: She is not in acute distress.    Appearance: She is well-developed. She is not diaphoretic.  HENT:     Head: Normocephalic and  atraumatic.     Right Ear: External ear normal.     Left Ear: External ear normal.     Nose: Nose normal.  Eyes:     General: No scleral icterus.    Conjunctiva/sclera: Conjunctivae normal.  Neck:     Musculoskeletal: Normal range of motion.     Trachea: Phonation normal.  Cardiovascular:     Rate and Rhythm: Normal rate and regular rhythm.  Pulmonary:     Effort: Pulmonary effort is normal. No respiratory distress.     Breath sounds: No stridor.  Abdominal:     General: There is no distension.     Tenderness: There is abdominal tenderness (discomfort) in the epigastric area. There is no guarding or rebound. Negative signs include Murphy's sign.  Musculoskeletal: Normal range of motion.  Neurological:     Mental Status: She is alert and oriented to person, place, and time.  Psychiatric:        Behavior: Behavior normal.     ED Results and  Treatments Labs (all labs ordered are listed, but only abnormal results are displayed) Labs Reviewed  COMPREHENSIVE METABOLIC PANEL - Abnormal; Notable for the following components:      Result Value   Albumin 3.4 (*)    All other components within normal limits  CBC - Abnormal; Notable for the following components:   RBC 3.73 (*)    Hemoglobin 10.0 (*)    HCT 33.3 (*)    All other components within normal limits  LIPASE, BLOOD  PREGNANCY, URINE                                                                                                                         EKG  EKG Interpretation  Date/Time:    Ventricular Rate:    PR Interval:    QRS Duration:   QT Interval:    QTC Calculation:   R Axis:     Text Interpretation:        Radiology No results found. Pertinent labs & imaging results that were available during my care of the patient were reviewed by me and considered in my medical decision making (see chart for details).  Medications Ordered in ED Medications  sodium chloride flush (NS) 0.9 % injection 3 mL (3 mLs Intravenous Not Given 08/01/18 0011)  alum & mag hydroxide-simeth (MAALOX/MYLANTA) 200-200-20 MG/5ML suspension 30 mL (30 mLs Oral Given 08/01/18 0107)  Procedures Procedures  (including critical care time)  Medical Decision Making / ED Course I have reviewed the nursing notes for this encounter and the patient's prior records (if available in EHR or on provided paperwork).    Patient with epigastric abdominal discomfort.  Mild discomfort to palpation without evidence of peritonitis.  No Murphy sign.  Labs reassuring without leukocytosis, significant electrolyte derangements or renal sufficiency.  No evidence of biliary obstruction or pancreatitis.  Urine pregnancy negative.  Patient treated symptomatically with GI cocktail  resulting in improved epigastric discomfort.  Able to tolerate oral intake.  Doubt serious intra-abdominal Fama to assess infectious process requiring imaging at this time.  The patient appears reasonably screened and/or stabilized for discharge and I doubt any other medical condition or other [facility] requiring further screening, evaluation, or treatment in the ED at this time prior to discharge.  The patient is safe for discharge with strict return precautions.   Final Clinical Impression(s) / ED Diagnoses Final diagnoses:  Epigastric abdominal pain    Disposition: Discharge  Condition: Good  I have discussed the results, Dx and Tx plan with the patient who expressed understanding and agree(s) with the plan. Discharge instructions discussed at great length. The patient was given strict return precautions who verbalized understanding of the instructions. No further questions at time of discharge.    ED Discharge Orders         Ordered    alum & mag hydroxide-simeth (MAALOX ADVANCED MAX ST) 400-400-40 MG/5ML suspension  Every 6 hours PRN     08/01/18 0203           Follow Up: Primary care provider  Schedule an appointment as soon as possible for a visit  If you do not have a primary care physician, contact [facility] at [phone] for referral     This chart was dictated using voice recognition software.  Despite best efforts to proofread,  errors can occur which can change the documentation meaning.   Nira Conn, MD 08/01/18 [phone]

## 2018-08-01 NOTE — ED Notes (Signed)
Pt attempted to obtain urine sample. Unsuccessful

## 2019-01-26 ENCOUNTER — Emergency Department (HOSPITAL_BASED_OUTPATIENT_CLINIC_OR_DEPARTMENT_OTHER): Payer: BC Managed Care – PPO

## 2019-01-26 ENCOUNTER — Encounter (HOSPITAL_BASED_OUTPATIENT_CLINIC_OR_DEPARTMENT_OTHER): Payer: Self-pay | Admitting: Emergency Medicine

## 2019-01-26 ENCOUNTER — Emergency Department (HOSPITAL_BASED_OUTPATIENT_CLINIC_OR_DEPARTMENT_OTHER)
Admission: EM | Admit: 2019-01-26 | Discharge: 2019-01-26 | Disposition: A | Discharge status: Home / Self Care | Payer: BC Managed Care – PPO | Attending: Emergency Medicine | Admitting: Emergency Medicine

## 2019-01-26 ENCOUNTER — Other Ambulatory Visit: Payer: Self-pay

## 2019-01-26 DIAGNOSIS — Z3A01 Less than 8 weeks gestation of pregnancy: Secondary | ICD-10-CM | POA: Diagnosis not present

## 2019-01-26 DIAGNOSIS — R1012 Left upper quadrant pain: Secondary | ICD-10-CM

## 2019-01-26 DIAGNOSIS — O26891 Other specified pregnancy related conditions, first trimester: Secondary | ICD-10-CM | POA: Insufficient documentation

## 2019-01-26 DIAGNOSIS — Z87891 Personal history of nicotine dependence: Secondary | ICD-10-CM | POA: Insufficient documentation

## 2019-01-26 LAB — HCG, QUANTITATIVE, PREGNANCY: hCG, Beta Chain, Quant, S: 231 m[IU]/mL — ABNORMAL HIGH

## 2019-01-26 LAB — COMPREHENSIVE METABOLIC PANEL WITH GFR
ALT: 14 U/L (ref 0–44)
AST: 16 U/L (ref 15–41)
Albumin: 3.3 g/dL — ABNORMAL LOW (ref 3.5–5.0)
Alkaline Phosphatase: 75 U/L (ref 38–126)
Anion gap: 7 (ref 5–15)
BUN: 13 mg/dL (ref 6–20)
CO2: 24 mmol/L (ref 22–32)
Calcium: 8.9 mg/dL (ref 8.9–10.3)
Chloride: 103 mmol/L (ref 98–111)
Creatinine, Ser: 0.83 mg/dL (ref 0.44–1.00)
GFR calc Af Amer: >60 mL/min
GFR calc non Af Amer: >60 mL/min
Glucose, Bld: 85 mg/dL (ref 70–99)
Potassium: 3.8 mmol/L (ref 3.5–5.1)
Sodium: 134 mmol/L — ABNORMAL LOW (ref 135–145)
Total Bilirubin: 0.3 mg/dL (ref 0.3–1.2)
Total Protein: 6.6 g/dL (ref 6.5–8.1)

## 2019-01-26 LAB — CBC WITH DIFFERENTIAL/PLATELET
Abs Immature Granulocytes: 0.01 10*3/uL (ref 0.00–0.07)
Basophils Absolute: 0 10*3/uL (ref 0.0–0.1)
Basophils Relative: 0 %
Eosinophils Absolute: 0.1 10*3/uL (ref 0.0–0.5)
Eosinophils Relative: 1 %
HCT: 33.3 % — ABNORMAL LOW (ref 36.0–46.0)
Hemoglobin: 10.3 g/dL — ABNORMAL LOW (ref 12.0–15.0)
Immature Granulocytes: 0 %
Lymphocytes Relative: 45 %
Lymphs Abs: 2.6 10*3/uL (ref 0.7–4.0)
MCH: 27.2 pg (ref 26.0–34.0)
MCHC: 30.9 g/dL (ref 30.0–36.0)
MCV: 87.9 fL (ref 80.0–100.0)
Monocytes Absolute: 0.6 10*3/uL (ref 0.1–1.0)
Monocytes Relative: 11 %
Neutro Abs: 2.4 10*3/uL (ref 1.7–7.7)
Neutrophils Relative %: 43 %
Platelets: 269 10*3/uL (ref 150–400)
RBC: 3.79 MIL/uL — ABNORMAL LOW (ref 3.87–5.11)
RDW: 13.5 % (ref 11.5–15.5)
WBC: 5.8 10*3/uL (ref 4.0–10.5)
nRBC: 0 % (ref 0.0–0.2)

## 2019-01-26 LAB — PREGNANCY, URINE: Preg Test, Ur: POSITIVE — AB

## 2019-01-26 LAB — URINALYSIS, ROUTINE W REFLEX MICROSCOPIC
Bilirubin Urine: NEGATIVE
Glucose, UA: NEGATIVE mg/dL
Hgb urine dipstick: NEGATIVE
Ketones, ur: NEGATIVE mg/dL
Leukocytes,Ua: NEGATIVE
Nitrite: NEGATIVE
Protein, ur: NEGATIVE mg/dL
Specific Gravity, Urine: 1.025 (ref 1.005–1.030)
pH: 6.5 (ref 5.0–8.0)

## 2019-01-26 NOTE — Discharge Instructions (Signed)
Follow-up with OB/GYN. We recommend repeat quantitative hCG level in 48 hours.  This can typically be performed through your OB/GYN office. Go to the emergency department at the [place] [facility] [place] or to [facility] emergency department should you have increased abdominal pain, vaginal bleeding, dizziness, fever, passing out, or any other major concerns.  For treatment of constipation, increase water intake and fiber intake.  For further assistance on this matter in pregnancy, please follow-up with OB/GYN.

## 2019-01-26 NOTE — ED Notes (Signed)
ED Provider at bedside. 

## 2019-01-26 NOTE — ED Notes (Signed)
Patient transported to Ultrasound 

## 2019-01-26 NOTE — ED Triage Notes (Addendum)
Pt c/o lower abd pain and LUQ abd pain. Pt had positive upreg test this morning. Pt reports some nausea. Pt reports increased urination.

## 2019-01-26 NOTE — ED Provider Notes (Signed)
[facility] EMERGENCY DEPARTMENT Provider Note   CSN: [id number] Arrival date & time: 01/26/19  1924     History   Chief Complaint Chief Complaint  Patient presents with  . Abdominal Pain    HPI Fontaine Kossman is a 33 y.o. female.     HPI   Eola Waldrep is a 33 y.o. female, with a history of G3P2, presenting to the ED with abdominal pain beginning last night.  She states it originally started in the left lower quadrant and has now moved to the left upper quadrant.  She describes the pain as "uncomfortable, sometimes cramping," 6/10, intermittent, nonradiating from these locations. Occasional nausea. She also complains of some constipation with hard stool over the last several days. She had a positive home pregnancy test this morning.  LMP July 28.   OBGYN: Dr. Bethann Goo with [facility] Denies fever/chills, chest pain, shortness of breath, cough, vomiting, diarrhea, hematochezia/melena, vaginal bleeding, vaginal discharge, syncope, trauma, flank/back pain, urinary symptoms, or any other complaints.   History reviewed. No pertinent past medical history.  There are no active problems to display for this patient.   Past Surgical History:  Procedure Laterality Date  . MOUTH SURGERY       OB History    Gravida  2   Para      Term      Preterm      AB      Living        SAB      TAB      Ectopic      Multiple      Live Births               Home Medications    Prior to Admission medications   Not on File    Family History No family history on file.  Social History Social History   Tobacco Use  . Smoking status: Former Smoker    Quit date: 03/17/2016    Years since quitting: 2.8  . Smokeless tobacco: Never Used  Substance Use Topics  . Alcohol use: Yes    Comment: social  . Drug use: No     Allergies   Patient has no known allergies.   Review of Systems Review of Systems  Constitutional: Negative for chills, diaphoresis  and fever.  Respiratory: Negative for cough and shortness of breath.   Cardiovascular: Negative for chest pain.  Gastrointestinal: Positive for abdominal pain and nausea. Negative for blood in stool, diarrhea and vomiting.  Genitourinary: Negative for dysuria, flank pain, frequency, hematuria, vaginal bleeding and vaginal discharge.  Musculoskeletal: Negative for back pain.  Neurological: Negative for syncope and weakness.  All other systems reviewed and are negative.    Physical Exam Updated Vital Signs BP 136/85 (BP Location: Left Arm)   Pulse 71   Temp 99.3 F (37.4 C) (Oral)   Resp 16   Ht 5\' 3"  (1.6 m)   Wt 96.2 kg   LMP 12/14/2018 (Exact Date)   SpO2 100%   BMI 37.55 kg/m   Physical Exam Vitals signs and nursing note reviewed.  Constitutional:      General: She is not in acute distress.    Appearance: She is well-developed. She is not diaphoretic.  HENT:     Head: Normocephalic and atraumatic.     Mouth/Throat:     Mouth: Mucous membranes are moist.     Pharynx: Oropharynx is clear.  Eyes:     Conjunctiva/sclera: Conjunctivae  normal.  Neck:     Musculoskeletal: Neck supple.  Cardiovascular:     Rate and Rhythm: Normal rate and regular rhythm.     Pulses: Normal pulses.          Radial pulses are 2+ on the right side and 2+ on the left side.       Posterior tibial pulses are 2+ on the right side and 2+ on the left side.     Heart sounds: Normal heart sounds.     Comments: Tactile temperature in the extremities appropriate and equal bilaterally. Pulmonary:     Effort: Pulmonary effort is normal. No respiratory distress.     Breath sounds: Normal breath sounds.  Abdominal:     Palpations: Abdomen is soft.     Tenderness: There is abdominal tenderness in the left upper quadrant and left lower quadrant. There is no right CVA tenderness, left CVA tenderness or guarding.     Comments: Tenderness seems to be mild.  Musculoskeletal:     Right lower leg: No edema.      Left lower leg: No edema.  Lymphadenopathy:     Cervical: No cervical adenopathy.  Skin:    General: Skin is warm and dry.  Neurological:     Mental Status: She is alert.  Psychiatric:        Mood and Affect: Mood and affect normal.        Speech: Speech normal.        Behavior: Behavior normal.      ED Treatments / Results  Labs (all labs ordered are listed, but only abnormal results are displayed) Labs Reviewed  PREGNANCY, URINE - Abnormal; Notable for the following components:      Result Value   Preg Test, Ur POSITIVE (*)    All other components within normal limits  COMPREHENSIVE METABOLIC PANEL - Abnormal; Notable for the following components:   Sodium 134 (*)    Albumin 3.3 (*)    All other components within normal limits  CBC WITH DIFFERENTIAL/PLATELET - Abnormal; Notable for the following components:   RBC 3.79 (*)    Hemoglobin 10.3 (*)    HCT 33.3 (*)    All other components within normal limits  HCG, QUANTITATIVE, PREGNANCY - Abnormal; Notable for the following components:   hCG, Beta Chain, Quant, S 231 (*)    All other components within normal limits  URINALYSIS, ROUTINE W REFLEX MICROSCOPIC   Hemoglobin  Date Value Ref Range Status  01/26/2019 10.3 (L) 12.0 - 15.0 g/dL Final  81/19/147803/15/2020 29.510.0 (L) 12.0 - 15.0 g/dL Final  62/13/086509/08/2015 78.411.2 (L) 12.0 - 15.0 g/dL Final  69/62/952805/10/2015 41.311.3 (L) 12.0 - 15.0 g/dL Final    EKG None  Radiology [place] Ob Comp < 14 Wks  Result Date: 01/26/2019 CLINICAL DATA:  Lower abdominal pain with positive pregnancy test EXAM: OBSTETRIC <14 WK US AND TRANSVAGINAL OB US TECHNIQUE: Both transabdominal and transvaginal ultrasound examinations were performed for complete evaluation of the gestation as well as the maternal uterus, adnexal regions, and pelvic cul-de-sac. Transvaginal technique was performed to assess early pregnancy. COMPARISON:  None. FINDINGS: Intrauterine gestational sac: There is question of a tiny hypodense lesion seen  within the uterus measuring 0.3 x 0.1 cm. Yolk sac:  Not Visualized. MSD: 2.3 mm   4 w   6 d Subchorionic hemorrhage:  None visualized. Maternal uterus/adnexae: Within the left ovary there is a probable corpus luteum cyst. The right ovary is unremarkable. No free fluid  in the cul-de-sac. IMPRESSION: Probable early intrauterine gestational sac, but no yolk sac, fetal pole, or cardiac activity yet visualized. Recommend follow-up quantitative B-HCG levels and follow-up US in 14 days to assess viability. This recommendation follows SRU consensus guidelines: Diagnostic Criteria for Nonviable Pregnancy Early in the First Trimester. Malva Limes Med 2013; 789:3810-17. Electronically Signed   By: Jonna Clark M.D.   On: 01/26/2019 22:26   US Ob Transvaginal  Result Date: 01/26/2019 CLINICAL DATA:  Lower abdominal pain with positive pregnancy test EXAM: OBSTETRIC <14 WK [place] AND TRANSVAGINAL OB US TECHNIQUE: Both transabdominal and transvaginal ultrasound examinations were performed for complete evaluation of the gestation as well as the maternal uterus, adnexal regions, and pelvic cul-de-sac. Transvaginal technique was performed to assess early pregnancy. COMPARISON:  None. FINDINGS: Intrauterine gestational sac: There is question of a tiny hypodense lesion seen within the uterus measuring 0.3 x 0.1 cm. Yolk sac:  Not Visualized. MSD: 2.3 mm   4 w   6 d Subchorionic hemorrhage:  None visualized. Maternal uterus/adnexae: Within the left ovary there is a probable corpus luteum cyst. The right ovary is unremarkable. No free fluid in the cul-de-sac. IMPRESSION: Probable early intrauterine gestational sac, but no yolk sac, fetal pole, or cardiac activity yet visualized. Recommend follow-up quantitative B-HCG levels and follow-up US in 14 days to assess viability. This recommendation follows SRU consensus guidelines: Diagnostic Criteria for Nonviable Pregnancy Early in the First Trimester. Malva Limes Med 2013; 510:2585-27.  Electronically Signed   By: Jonna Clark M.D.   On: 01/26/2019 22:26    Procedures Procedures (including critical care time)  Medications Ordered in ED Medications - No data to display   Initial Impression / Assessment and Plan / ED Course  I have reviewed the triage vital signs and the nursing notes.  Pertinent labs & imaging results that were available during my care of the patient were reviewed by me and considered in my medical decision making (see chart for details).  Clinical Course as of Jan 25 2245  Wed Jan 26, 2019  2230 Discussed ultrasound results with the patient.  Her pain is minimal.  Repeat abdominal exam benign.   [SJ]    Clinical Course User Index [SJ] Erica Osuna C, PA-C       Patient presents with abdominal pain. Patient is nontoxic appearing, afebrile, not tachycardic, not tachypneic, not hypotensive, maintains excellent SPO2 on room air, and is in no apparent distress.  Pregnancy test positive with quantitative hCG level of 231. Calculation of gestational age based on LMP puts the patient around 6 weeks, however, this hCG level is not consistent with 6-week intrauterine pregnancy. Ultrasound with possible intrauterine gestational sac with estimated age of 4 weeks, which would be more consistent with the patient's above hCG level. Recommended patient follow-up with OB/GYN and have repeat quantitative hCG in 48 hours. Patient's abdominal discomfort could simply be due to constipation. The patient was given instructions for home care as well as return precautions. Patient voices understanding of these instructions, accepts the plan, and is comfortable with discharge.  Findings and plan of care discussed with Virgina Norfolk, DO.    Vitals:   01/26/19 1930 01/26/19 1934 01/26/19 2134  BP:  136/85 126/82  Pulse:  71 64  Resp:  16 16  Temp:  99.3 F (37.4 C) 99 F (37.2 C)  TempSrc:  Oral Oral  SpO2:  100% 100%  Weight: 96.2 kg    Height: 5\' 3"  (1.6 m)  Final Clinical Impressions(s) / ED Diagnoses   Final diagnoses:  Left upper quadrant pain  Less than [redacted] weeks gestation of pregnancy    ED Discharge Orders    None       Concepcion LivingJoy, Muranda Coye C, PA-C 01/26/19 2251    Virgina Norfolkuratolo, Adam, DO 01/26/19 2309

## 2020-04-05 ENCOUNTER — Other Ambulatory Visit: Payer: Self-pay

## 2020-04-05 ENCOUNTER — Encounter (HOSPITAL_BASED_OUTPATIENT_CLINIC_OR_DEPARTMENT_OTHER): Payer: Self-pay

## 2020-04-05 ENCOUNTER — Emergency Department (HOSPITAL_BASED_OUTPATIENT_CLINIC_OR_DEPARTMENT_OTHER): Payer: BC Managed Care – PPO

## 2020-04-05 ENCOUNTER — Emergency Department (HOSPITAL_BASED_OUTPATIENT_CLINIC_OR_DEPARTMENT_OTHER)
Admission: EM | Admit: 2020-04-05 | Discharge: 2020-04-05 | Disposition: A | Discharge status: Home / Self Care | Payer: BC Managed Care – PPO | Attending: Emergency Medicine | Admitting: Emergency Medicine

## 2020-04-05 DIAGNOSIS — R110 Nausea: Secondary | ICD-10-CM

## 2020-04-05 DIAGNOSIS — Z3A00 Weeks of gestation of pregnancy not specified: Secondary | ICD-10-CM | POA: Diagnosis not present

## 2020-04-05 DIAGNOSIS — Z3A01 Less than 8 weeks gestation of pregnancy: Secondary | ICD-10-CM

## 2020-04-05 DIAGNOSIS — O26891 Other specified pregnancy related conditions, first trimester: Secondary | ICD-10-CM | POA: Diagnosis present

## 2020-04-05 DIAGNOSIS — R1011 Right upper quadrant pain: Secondary | ICD-10-CM | POA: Diagnosis not present

## 2020-04-05 DIAGNOSIS — O219 Vomiting of pregnancy, unspecified: Secondary | ICD-10-CM | POA: Diagnosis not present

## 2020-04-05 DIAGNOSIS — R109 Unspecified abdominal pain: Secondary | ICD-10-CM

## 2020-04-05 DIAGNOSIS — Z87891 Personal history of nicotine dependence: Secondary | ICD-10-CM | POA: Insufficient documentation

## 2020-04-05 LAB — URINALYSIS, ROUTINE W REFLEX MICROSCOPIC
Bilirubin Urine: NEGATIVE
Glucose, UA: NEGATIVE mg/dL
Hgb urine dipstick: NEGATIVE
Ketones, ur: NEGATIVE mg/dL
Nitrite: NEGATIVE
Protein, ur: NEGATIVE mg/dL
Specific Gravity, Urine: 1.02 (ref 1.005–1.030)
pH: 6 (ref 5.0–8.0)

## 2020-04-05 LAB — CBC WITH DIFFERENTIAL/PLATELET
Abs Immature Granulocytes: 0.03 10*3/uL (ref 0.00–0.07)
Basophils Absolute: 0 10*3/uL (ref 0.0–0.1)
Basophils Relative: 0 %
Eosinophils Absolute: 0.1 10*3/uL (ref 0.0–0.5)
Eosinophils Relative: 1 %
HCT: 34.5 % — ABNORMAL LOW (ref 36.0–46.0)
Hemoglobin: 10.7 g/dL — ABNORMAL LOW (ref 12.0–15.0)
Immature Granulocytes: 0 %
Lymphocytes Relative: 36 %
Lymphs Abs: 2.8 10*3/uL (ref 0.7–4.0)
MCH: 26.1 pg (ref 26.0–34.0)
MCHC: 31 g/dL (ref 30.0–36.0)
MCV: 84.1 fL (ref 80.0–100.0)
Monocytes Absolute: 0.6 10*3/uL (ref 0.1–1.0)
Monocytes Relative: 7 %
Neutro Abs: 4.2 10*3/uL (ref 1.7–7.7)
Neutrophils Relative %: 56 %
Platelets: 353 10*3/uL (ref 150–400)
RBC: 4.1 MIL/uL (ref 3.87–5.11)
RDW: 13.9 % (ref 11.5–15.5)
WBC: 7.7 10*3/uL (ref 4.0–10.5)
nRBC: 0 % (ref 0.0–0.2)

## 2020-04-05 LAB — COMPREHENSIVE METABOLIC PANEL
ALT: 14 U/L (ref 0–44)
AST: 14 U/L — ABNORMAL LOW (ref 15–41)
Albumin: 3.3 g/dL — ABNORMAL LOW (ref 3.5–5.0)
Alkaline Phosphatase: 96 U/L (ref 38–126)
Anion gap: 8 (ref 5–15)
BUN: 14 mg/dL (ref 6–20)
CO2: 23 mmol/L (ref 22–32)
Calcium: 8.7 mg/dL — ABNORMAL LOW (ref 8.9–10.3)
Chloride: 103 mmol/L (ref 98–111)
Creatinine, Ser: 0.77 mg/dL (ref 0.44–1.00)
GFR, Estimated: >60 mL/min (ref >60)
Glucose, Bld: 84 mg/dL (ref 70–99)
Potassium: 3.8 mmol/L (ref 3.5–5.1)
Sodium: 134 mmol/L — ABNORMAL LOW (ref 135–145)
Total Bilirubin: 0.1 mg/dL — ABNORMAL LOW (ref 0.3–1.2)
Total Protein: 7.1 g/dL (ref 6.5–8.1)

## 2020-04-05 LAB — PREGNANCY, URINE: Preg Test, Ur: POSITIVE — AB

## 2020-04-05 LAB — LIPASE, BLOOD: Lipase: 35 U/L (ref 11–51)

## 2020-04-05 LAB — URINALYSIS, MICROSCOPIC (REFLEX)

## 2020-04-05 MED ORDER — METOCLOPRAMIDE HCL 10 MG PO TABS: 10.0000 mg | ORAL_TABLET | Freq: Three times a day (TID) | ORAL | 0 refills | Status: AC | PRN

## 2020-04-05 NOTE — ED Notes (Signed)
Patient transported to Ultrasound 

## 2020-04-05 NOTE — ED Triage Notes (Signed)
Pt c/o right side abd pain, nausea x 3 days-NAD-steady gait

## 2020-04-05 NOTE — ED Provider Notes (Signed)
[facility] EMERGENCY DEPARTMENT Provider Note   CSN: [id number] Arrival date & time: 04/05/20  1844     History Chief Complaint  Patient presents with  . Abdominal Pain    Dawn Heath is a 34 y.o. female.  The history is provided by the patient. No language interpreter was used.  Abdominal Pain Pain location:  RUQ Pain quality: aching and bloating   Pain radiates to:  Does not radiate Pain severity:  Moderate Onset quality:  Gradual Timing:  Constant Progression:  Worsening Relieved by:  Nothing Worsened by:  Nothing Ineffective treatments:  None tried Associated symptoms: nausea and vomiting    Pt complains of abdominal bloating for the last 3 days.  Pt reports nausea and vomiting.  Pt reports soreness in right upper abdomen    History reviewed. No pertinent past medical history.  There are no problems to display for this patient.   Past Surgical History:  Procedure Laterality Date  . MOUTH SURGERY       OB History    Gravida  2   Para      Term      Preterm      AB      Living        SAB      TAB      Ectopic      Multiple      Live Births              No family history on file.  Social History   Tobacco Use  . Smoking status: Former Smoker    Quit date: 03/17/2016    Years since quitting: 4.0  . Smokeless tobacco: Never Used  Vaping Use  . Vaping Use: Never used  Substance Use Topics  . Alcohol use: Yes    Comment: social  . Drug use: No    Home Medications Prior to Admission medications   Medication Sig Start Date End Date Taking? Authorizing Provider  metoCLOPramide (REGLAN) 10 MG tablet Take 1 tablet (10 mg total) by mouth every 8 (eight) hours as needed for nausea or vomiting. 04/05/20 04/05/21  Elson Areas, PA-C    Allergies    Patient has no known allergies.  Review of Systems   Review of Systems  Gastrointestinal: Positive for abdominal pain, nausea and vomiting.  All other systems  reviewed and are negative.   Physical Exam Updated Vital Signs BP 140/75 (BP Location: Left Arm)   Pulse 70   Temp 98.3 F (36.8 C)   Resp 20   Ht 5\' 4"  (1.626 m)   Wt 129.3 kg   LMP 03/08/2020   SpO2 100%   Breastfeeding Unknown   BMI 48.92 kg/m   Physical Exam Vitals and nursing note reviewed.  Constitutional:      Appearance: She is well-developed.  HENT:     Head: Normocephalic.  Cardiovascular:     Rate and Rhythm: Normal rate and regular rhythm.  Pulmonary:     Effort: Pulmonary effort is normal.  Abdominal:     General: Bowel sounds are normal. There is no distension.     Palpations: Abdomen is soft.     Tenderness: There is abdominal tenderness in the right upper quadrant.  Musculoskeletal:        General: Normal range of motion.     Cervical back: Normal range of motion.  Skin:    General: Skin is warm.  Neurological:     General: No focal  deficit present.     Mental Status: She is alert and oriented to person, place, and time.  Psychiatric:        Mood and Affect: Mood normal.     ED Results / Procedures / Treatments   Labs (all labs ordered are listed, but only abnormal results are displayed) Labs Reviewed  CBC WITH DIFFERENTIAL/PLATELET - Abnormal; Notable for the following components:      Result Value   Hemoglobin 10.7 (*)    HCT 34.5 (*)    All other components within normal limits  COMPREHENSIVE METABOLIC PANEL - Abnormal; Notable for the following components:   Sodium 134 (*)    Calcium 8.7 (*)    Albumin 3.3 (*)    AST 14 (*)    Total Bilirubin 0.1 (*)    All other components within normal limits  URINALYSIS, ROUTINE W REFLEX MICROSCOPIC - Abnormal; Notable for the following components:   APPearance HAZY (*)    Leukocytes,Ua TRACE (*)    All other components within normal limits  PREGNANCY, URINE - Abnormal; Notable for the following components:   Preg Test, Ur POSITIVE (*)    All other components within normal limits  URINALYSIS,  MICROSCOPIC (REFLEX) - Abnormal; Notable for the following components:   Bacteria, UA FEW (*)    All other components within normal limits  LIPASE, BLOOD    EKG None  Radiology US Abdomen Limited RUQ (LIVER/GB)  Result Date: 04/05/2020 CLINICAL DATA:  Right upper quadrant pain EXAM: ULTRASOUND ABDOMEN LIMITED RIGHT UPPER QUADRANT COMPARISON:  None. FINDINGS: Gallbladder: No gallstones or wall thickening visualized. No sonographic Murphy sign noted by sonographer. Common bile duct: Diameter: Normal caliber, 3 mm Liver: No focal lesion identified. Within normal limits in parenchymal echogenicity. Portal vein is patent on color Doppler imaging with normal direction of blood flow towards the liver. Other: None. IMPRESSION: Normal right upper quadrant ultrasound. Electronically Signed   By: Charlett Nose M.D.   On: 04/05/2020 20:10    Procedures Procedures (including critical care time)  Medications Ordered in ED Medications - No data to display  ED Course  I have reviewed the triage vital signs and the nursing notes.  Pertinent labs & imaging results that were available during my care of the patient were reviewed by me and considered in my medical decision making (see chart for details).    MDM Rules/Calculators/A&P                          MDM:  LMP 10/15.  Pregnancy test is positive.  Labs reviewed  Ultrasound RUQ no gallstones.  I counseled pt on early pregnancy.  Pt advised to call her ob to be seen for evaluation. Pt given rx for reglan   Final Clinical Impression(s) / ED Diagnoses Final diagnoses:  Abdominal pain  Nausea  Less than [redacted] weeks gestation of pregnancy    Rx / DC Orders ED Discharge Orders         Ordered    metoCLOPramide (REGLAN) 10 MG tablet  Every 8 hours PRN        04/05/20 2057        An After Visit Summary was printed and given to the patient.    Elson Areas, PA-C 04/05/20 2314    Charlynne Pander, MD 04/05/20 [phone]

## 2020-04-05 NOTE — Discharge Instructions (Signed)
Schedule to see your OB for recheck.  Return if any problems.

## 2020-04-17 ENCOUNTER — Other Ambulatory Visit: Payer: Self-pay

## 2020-04-17 ENCOUNTER — Emergency Department (HOSPITAL_BASED_OUTPATIENT_CLINIC_OR_DEPARTMENT_OTHER)
Admission: EM | Admit: 2020-04-17 | Discharge: 2020-04-17 | Disposition: A | Discharge status: Home / Self Care | Payer: BC Managed Care – PPO | Attending: Emergency Medicine | Admitting: Emergency Medicine

## 2020-04-17 ENCOUNTER — Emergency Department (HOSPITAL_BASED_OUTPATIENT_CLINIC_OR_DEPARTMENT_OTHER): Payer: BC Managed Care – PPO

## 2020-04-17 ENCOUNTER — Encounter (HOSPITAL_BASED_OUTPATIENT_CLINIC_OR_DEPARTMENT_OTHER): Payer: Self-pay | Admitting: *Deleted

## 2020-04-17 DIAGNOSIS — I10 Essential (primary) hypertension: Secondary | ICD-10-CM | POA: Insufficient documentation

## 2020-04-17 DIAGNOSIS — R1031 Right lower quadrant pain: Secondary | ICD-10-CM

## 2020-04-17 DIAGNOSIS — O209 Hemorrhage in early pregnancy, unspecified: Secondary | ICD-10-CM | POA: Insufficient documentation

## 2020-04-17 DIAGNOSIS — Z3A01 Less than 8 weeks gestation of pregnancy: Secondary | ICD-10-CM | POA: Insufficient documentation

## 2020-04-17 DIAGNOSIS — Z87891 Personal history of nicotine dependence: Secondary | ICD-10-CM | POA: Insufficient documentation

## 2020-04-17 DIAGNOSIS — N939 Abnormal uterine and vaginal bleeding, unspecified: Secondary | ICD-10-CM

## 2020-04-17 HISTORY — DX: Essential (primary) hypertension: I10

## 2020-04-17 HISTORY — DX: Encounter for supervision of normal pregnancy, unspecified, first trimester: Z34.91

## 2020-04-17 LAB — COMPREHENSIVE METABOLIC PANEL
ALT: 18 U/L (ref 0–44)
AST: 16 U/L (ref 15–41)
Albumin: 3.3 g/dL — ABNORMAL LOW (ref 3.5–5.0)
Alkaline Phosphatase: 105 U/L (ref 38–126)
Anion gap: 7 (ref 5–15)
BUN: 14 mg/dL (ref 6–20)
CO2: 24 mmol/L (ref 22–32)
Calcium: 8.9 mg/dL (ref 8.9–10.3)
Chloride: 104 mmol/L (ref 98–111)
Creatinine, Ser: 0.79 mg/dL (ref 0.44–1.00)
GFR, Estimated: >60 mL/min (ref >60)
Glucose, Bld: 68 mg/dL — ABNORMAL LOW (ref 70–99)
Potassium: 3.7 mmol/L (ref 3.5–5.1)
Sodium: 135 mmol/L (ref 135–145)
Total Bilirubin: 0.1 mg/dL — ABNORMAL LOW (ref 0.3–1.2)
Total Protein: 7.5 g/dL (ref 6.5–8.1)

## 2020-04-17 LAB — CBC WITH DIFFERENTIAL/PLATELET
Abs Immature Granulocytes: 0.02 10*3/uL (ref 0.00–0.07)
Basophils Absolute: 0 10*3/uL (ref 0.0–0.1)
Basophils Relative: 0 %
Eosinophils Absolute: 0.1 10*3/uL (ref 0.0–0.5)
Eosinophils Relative: 1 %
HCT: 35.3 % — ABNORMAL LOW (ref 36.0–46.0)
Hemoglobin: 11 g/dL — ABNORMAL LOW (ref 12.0–15.0)
Immature Granulocytes: 0 %
Lymphocytes Relative: 35 %
Lymphs Abs: 2.4 10*3/uL (ref 0.7–4.0)
MCH: 26.4 pg (ref 26.0–34.0)
MCHC: 31.2 g/dL (ref 30.0–36.0)
MCV: 84.9 fL (ref 80.0–100.0)
Monocytes Absolute: 0.6 10*3/uL (ref 0.1–1.0)
Monocytes Relative: 8 %
Neutro Abs: 3.8 10*3/uL (ref 1.7–7.7)
Neutrophils Relative %: 56 %
Platelets: 334 10*3/uL (ref 150–400)
RBC: 4.16 MIL/uL (ref 3.87–5.11)
RDW: 13.9 % (ref 11.5–15.5)
WBC: 6.8 10*3/uL (ref 4.0–10.5)
nRBC: 0 % (ref 0.0–0.2)

## 2020-04-17 LAB — HCG, QUANTITATIVE, PREGNANCY: hCG, Beta Chain, Quant, S: 57700 m[IU]/mL — ABNORMAL HIGH (ref <5)

## 2020-04-17 NOTE — ED Provider Notes (Signed)
[facility] EMERGENCY DEPARTMENT Provider Note   CSN: [id number] Arrival date & time: 04/17/20  6073     History Chief Complaint  Patient presents with  . Abdominal Pain    [redacted] wk pregnant    Dawn Heath is a 34 y.o. female.  The history is provided by the patient.  Abdominal Pain Pain location:  Generalized Pain quality: bloating   Pain radiates to:  Does not radiate Pain severity:  Mild Progression:  Resolved Chronicity:  New Context comment:  Pregnant, around 6-7 weeks, some spotting today and intercourse Relieved by:  Nothing Worsened by:  Nothing Associated symptoms: vaginal bleeding (scant, resolved)   Associated symptoms: no anorexia, no belching, no chest pain, no chills, no constipation, no cough, no diarrhea, no dysuria, no fever, no hematuria, no shortness of breath, no sore throat and no vomiting   Risk factors: pregnancy        Past Medical History:  Diagnosis Date  . Hypertension    gestational  . Pregnant and not yet delivered in first trimester     There are no problems to display for this patient.   Past Surgical History:  Procedure Laterality Date  . MOUTH SURGERY       OB History    Gravida  3   Para      Term      Preterm      AB      Living        SAB      TAB      Ectopic      Multiple      Live Births              No family history on file.  Social History   Tobacco Use  . Smoking status: Former Smoker    Quit date: 03/17/2016    Years since quitting: 4.0  . Smokeless tobacco: Never Used  Vaping Use  . Vaping Use: Never used  Substance Use Topics  . Alcohol use: Not Currently    Comment: social  . Drug use: No    Home Medications Prior to Admission medications   Medication Sig Start Date End Date Taking? Authorizing Provider  metoCLOPramide (REGLAN) 10 MG tablet Take 1 tablet (10 mg total) by mouth every 8 (eight) hours as needed for nausea or vomiting. 04/05/20 04/05/21  Elson Areas, PA-C    Allergies    Patient has no known allergies.  Review of Systems   Review of Systems  Constitutional: Negative for chills and fever.  HENT: Negative for ear pain and sore throat.   Eyes: Negative for pain and visual disturbance.  Respiratory: Negative for cough and shortness of breath.   Cardiovascular: Negative for chest pain and palpitations.  Gastrointestinal: Positive for abdominal pain. Negative for anorexia, constipation, diarrhea and vomiting.  Genitourinary: Positive for vaginal bleeding (scant, resolved). Negative for dysuria and hematuria.  Musculoskeletal: Negative for arthralgias and back pain.  Skin: Negative for color change and rash.  Neurological: Negative for seizures and syncope.  All other systems reviewed and are negative.   Physical Exam Updated Vital Signs  ED Triage Vitals  Enc Vitals Group     BP 04/17/20 1937 (!) 143/95     Pulse Rate 04/17/20 1937 75     Resp 04/17/20 1937 16     Temp 04/17/20 1937 98.6 F (37 C)     Temp Source 04/17/20 1937 Oral     SpO2  04/17/20 1937 100 %     Weight 04/17/20 1934 280 lb (127 kg)     Height 04/17/20 1934 5\' 4"  (1.626 m)     Head Circumference --      Peak Flow --      Pain Score 04/17/20 1934 6     Pain Loc --      Pain Edu? --      Excl. in GC? --     Physical Exam Vitals and nursing note reviewed.  Constitutional:      General: She is not in acute distress.    Appearance: She is well-developed. She is not ill-appearing.  HENT:     Head: Normocephalic and atraumatic.  Eyes:     Extraocular Movements: Extraocular movements intact.     Conjunctiva/sclera: Conjunctivae normal.  Cardiovascular:     Rate and Rhythm: Normal rate and regular rhythm.     Heart sounds: Normal heart sounds. No murmur heard.   Pulmonary:     Effort: Pulmonary effort is normal. No respiratory distress.     Breath sounds: Normal breath sounds.  Abdominal:     General: There is no distension.      Palpations: Abdomen is soft.     Tenderness: There is no abdominal tenderness. There is no guarding.  Musculoskeletal:     Cervical back: Neck supple.  Skin:    General: Skin is warm and dry.     Capillary Refill: Capillary refill takes less than 2 seconds.  Neurological:     General: No focal deficit present.     Mental Status: She is alert.  Psychiatric:        Mood and Affect: Mood normal.     ED Results / Procedures / Treatments   Labs (all labs ordered are listed, but only abnormal results are displayed) Labs Reviewed  CBC WITH DIFFERENTIAL/PLATELET - Abnormal; Notable for the following components:      Result Value   Hemoglobin 11.0 (*)    HCT 35.3 (*)    All other components within normal limits  COMPREHENSIVE METABOLIC PANEL - Abnormal; Notable for the following components:   Glucose, Bld 68 (*)    Albumin 3.3 (*)    Total Bilirubin 0.1 (*)    All other components within normal limits  HCG, QUANTITATIVE, PREGNANCY - Abnormal; Notable for the following components:   hCG, Beta Chain, Quant, S 57,700 (*)    All other components within normal limits    EKG None  Radiology 04/19/20 OB LESS THAN 14 WEEKS WITH OB TRANSVAGINAL  Result Date: 04/17/2020 CLINICAL DATA:  Bleeding, spotting and right lower quadrant cramping for 1 day, quantitative beta hCG pending EXAM: OBSTETRIC <14 WK 04/19/2020 AND TRANSVAGINAL OB US TECHNIQUE: Both transabdominal and transvaginal ultrasound examinations were performed for complete evaluation of the gestation as well as the maternal uterus, adnexal regions, and pelvic cul-de-sac. Transvaginal technique was performed to assess early pregnancy. COMPARISON:  Ultrasound 08/22/2019. FINDINGS: Intrauterine gestational sac: Single Yolk sac:  Visualized. Embryo:  Visualized. Cardiac Activity: Visualized. Heart Rate: 108 bpm CRL:  8.2 mm   6 w   5 d                  10/22/2019 EDC: 12/06/2020 Subchorionic hemorrhage:  None visualized. Maternal uterus/adnexae: The anteverted  maternal uterus is otherwise unremarkable. Right ovary measures 3.8 x 2.6 x 2.2 cm, volume 11.7 mL. No concerning right adnexal lesion. Left ovary measures 2.6 by 5.2 x 2.5 cm for  a total volume the 17.6 mL, asymmetry in size likely attributable to a pair of dominant follicles the left ovary measuring up to 2.3 cm in size. No further specific imaging follow-up is warranted. No free fluid. IMPRESSION: Single intrauterine gestation with estimated gestational age of [redacted] weeks, 5 days based on crown-rump length measurement. Fetal heart rate of 108 beats per minute is borderline low though may be related to early gestation or technical factors given a diminutive size of the fetal. No other acute sonographic complication is evident. Electronically Signed   By: Kreg Shropshire M.D.   On: 04/17/2020 20:54    Procedures Procedures (including critical care time)  Medications Ordered in ED Medications - No data to display  ED Course  I have reviewed the triage vital signs and the nursing notes.  Pertinent labs & imaging results that were available during my care of the patient were reviewed by me and considered in my medical decision making (see chart for details).    MDM Rules/Calculators/A&P                          Domonic Hiscox is a 34 year old female with no significant medical history presents the ED with vaginal bleeding.  Normal vitals.  No fever.  Had some crampy abdominal pain that has now resolved.  Has had some spotting after sexual intercourse.  No heavy bleeding currently or any bleeding.  Patient has already had an ultrasound prior to my evaluation that shows a single IUP around 6 weeks and 5 days.  Fetal heart rate was 108.  Possibly due to early gestation or technical factors.  She is in no distress.  No bleeding.  Lab work is overall unremarkable.  She is Rh+.  She has follow-up with OB and understands return precautions.  Suspect that this was implanted in bleeding or bleeding after intercourse.   Told about possible early miscarriage and understands return precautions.  Discharged in good condition.  This chart was dictated using voice recognition software.  Despite best efforts to proofread,  errors can occur which can change the documentation meaning.   Final Clinical Impression(s) / ED Diagnoses Final diagnoses:  Less than [redacted] weeks gestation of pregnancy    Rx / DC Orders ED Discharge Orders    None       Virgina Norfolk, DO 04/17/20 2223

## 2020-04-17 NOTE — ED Triage Notes (Signed)
Pt reports she is [redacted] weeks pregnant. States she has had "crampy" RLQ pain since last night with vaginal bleeding (spotting)

## 2020-04-17 NOTE — Discharge Instructions (Addendum)
Please follow-up with OB.  Please return if symptoms worsen.

## 2020-11-28 IMAGING — US US ABDOMEN LIMITED
1 series · 14 of 25 positions shown · non-contrast
Comparison: None.

CLINICAL DATA: Right upper quadrant pain

EXAM:
ULTRASOUND ABDOMEN LIMITED RIGHT UPPER QUADRANT

[Series 1: us abdomen limited · 14 of 51 slices shown]
[im 1/51]
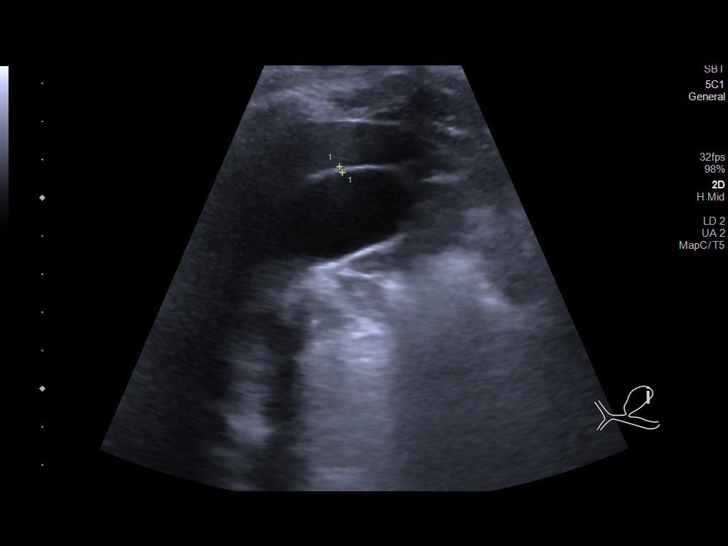
[im 5/51]
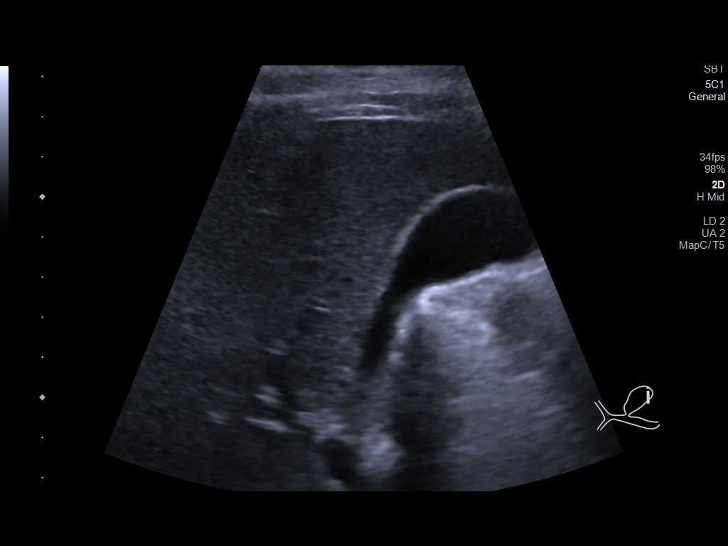
[im 9/51]
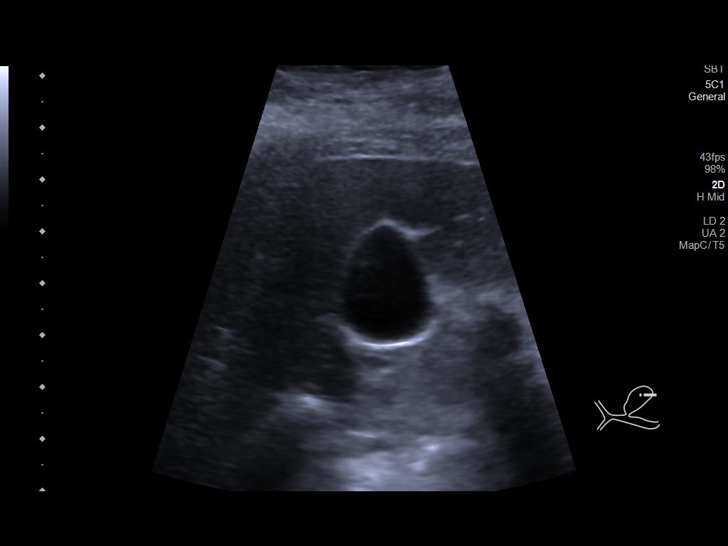
[im 13/51]
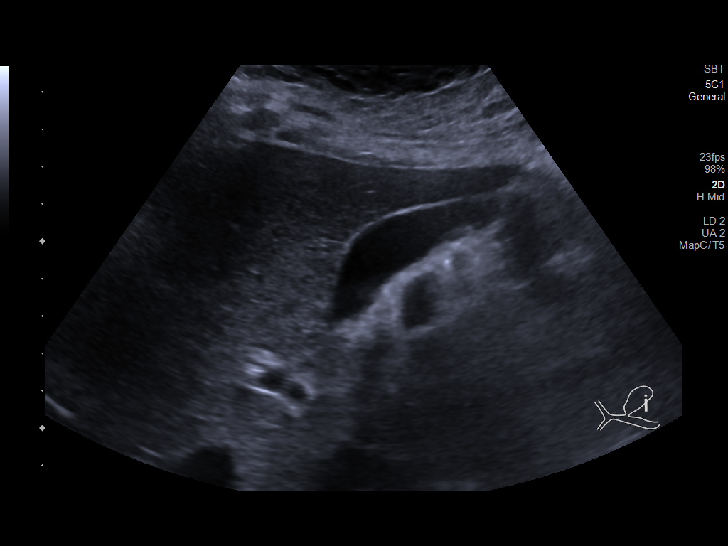
[im 17/51]
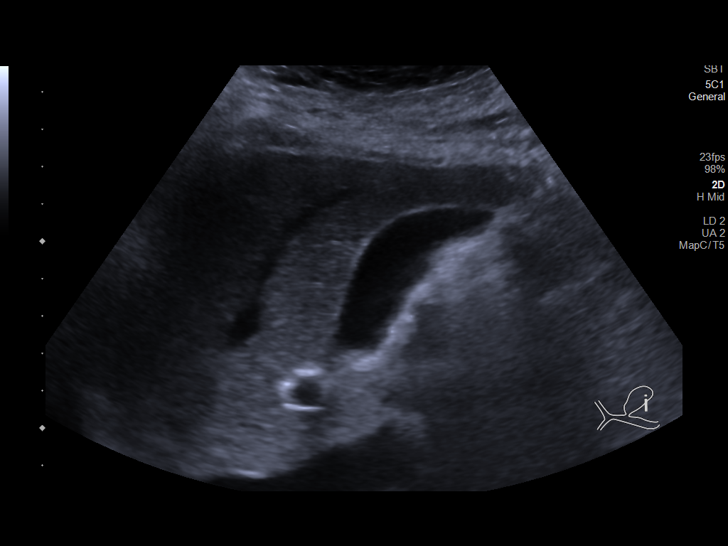
[im 19/51]
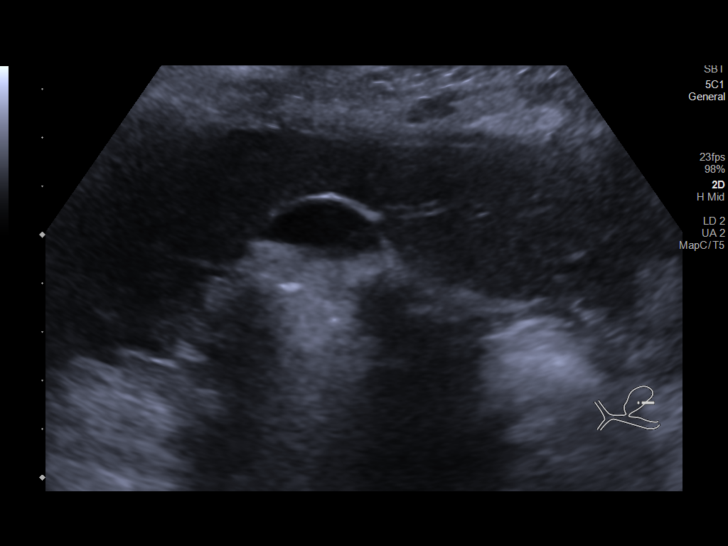
[im 23/51]
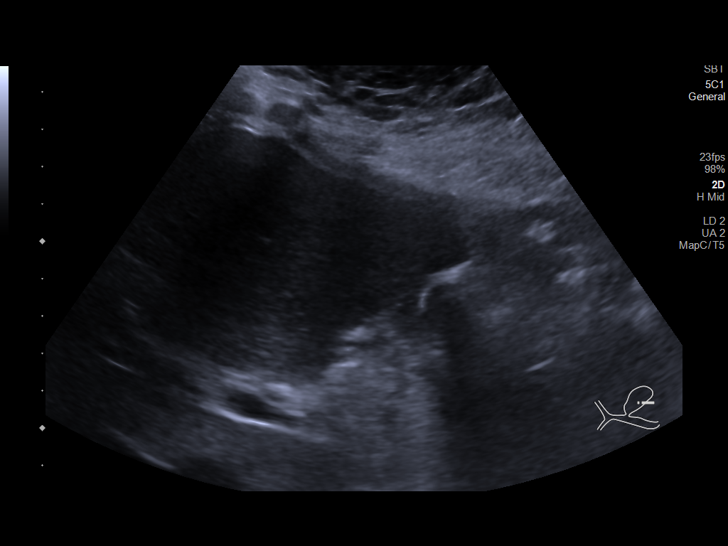
[im 28/51]
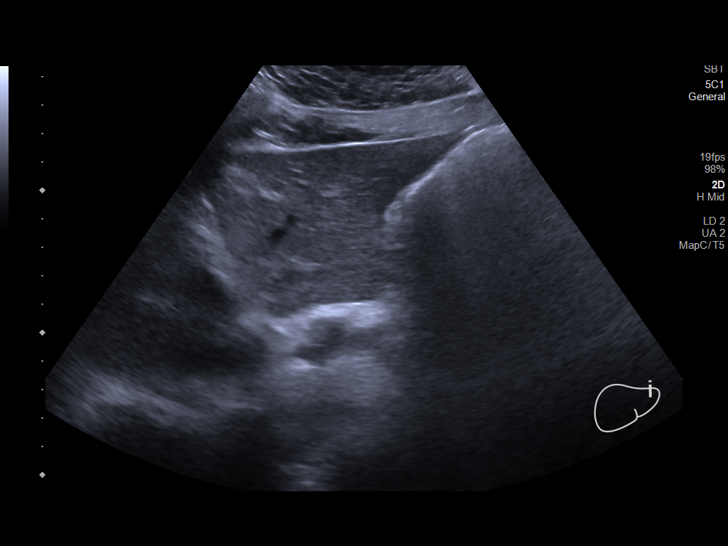
[im 32/51]
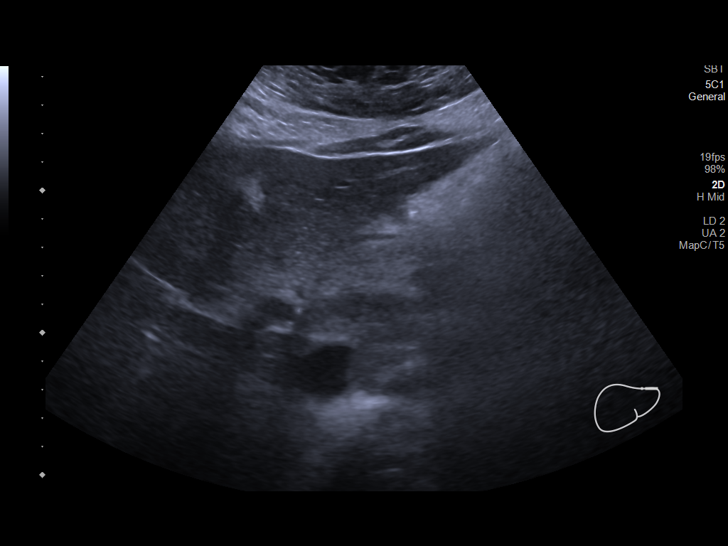
[im 34/51]
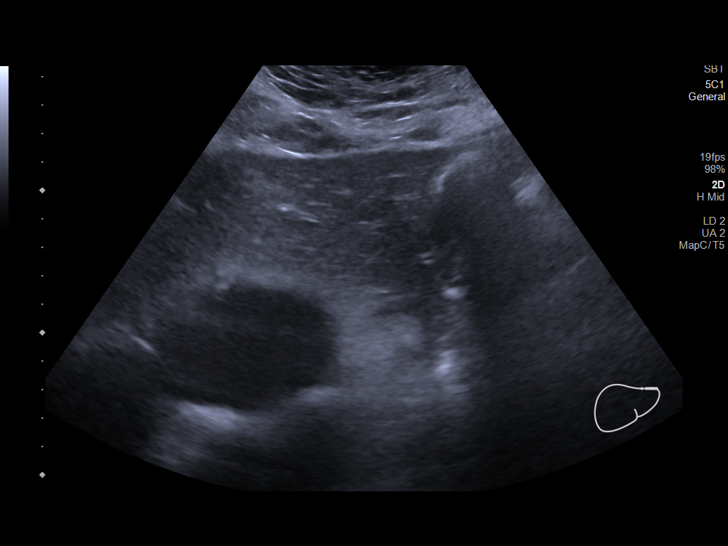
[im 38/51]
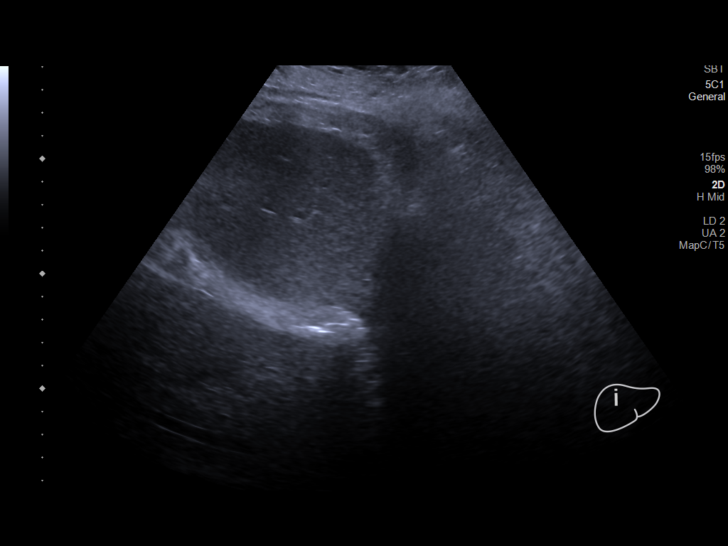
[im 42/51]
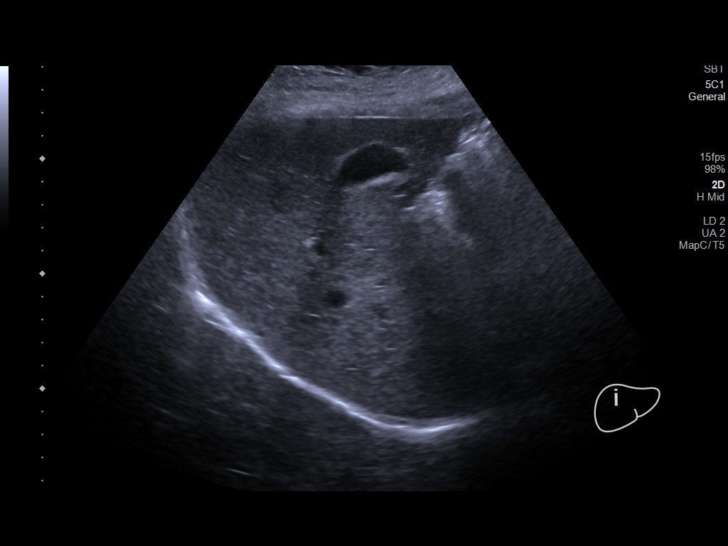
[im 46/51]
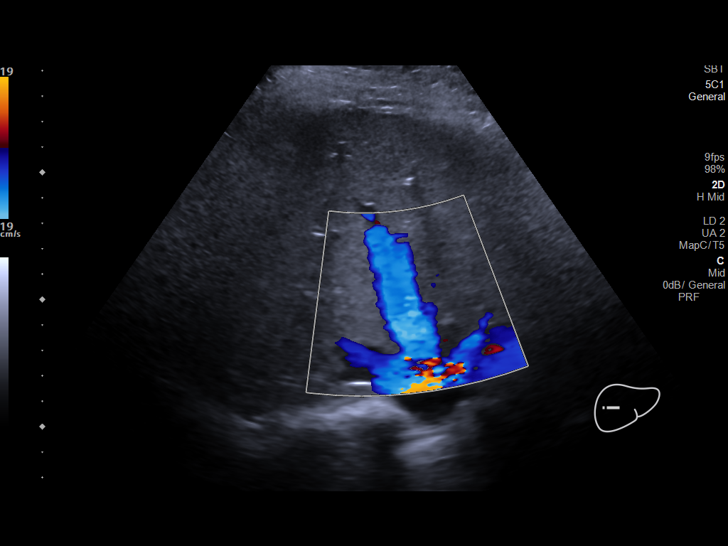
[im 51/51]
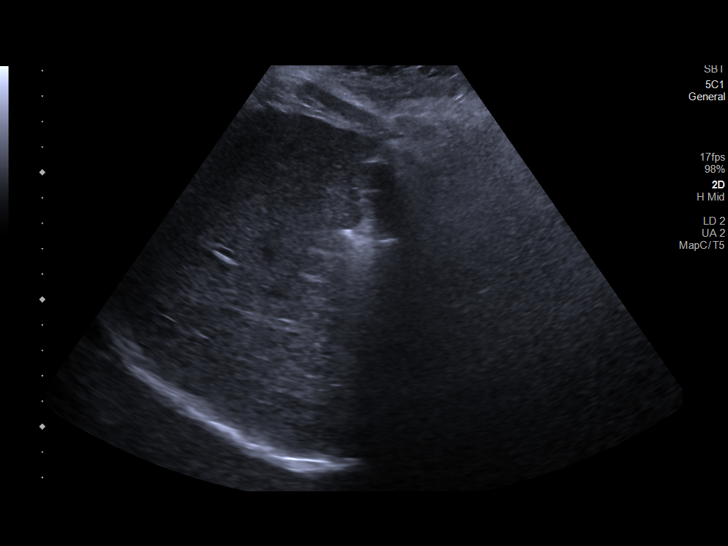

[14 of 25 positions shown; findings below may reference images not displayed]

FINDINGS: Gallbladder:

No gallstones or wall thickening visualized. No sonographic Murphy
sign noted by sonographer.

Common bile duct:

Diameter: Normal caliber, 3 mm

Liver:

No focal lesion identified. Within normal limits in parenchymal
echogenicity. Portal vein is patent on color Doppler imaging with
normal direction of blood flow towards the liver.

Other: None.
IMPRESSION: Normal right upper quadrant ultrasound.

## 2020-12-10 IMAGING — US US OB < 14 WEEKS - US OB TV
1 series · 13 of 28 positions shown · non-contrast
Comparison: Ultrasound 08/22/2019.

CLINICAL DATA: Bleeding, spotting and right lower quadrant cramping
for 1 day, quantitative beta hCG pending

EXAM:
OBSTETRIC <14 WK US AND TRANSVAGINAL OB US
TECHNIQUE: Both transabdominal and transvaginal ultrasound examinations were
performed for complete evaluation of the gestation as well as the
maternal uterus, adnexal regions, and pelvic cul-de-sac.
Transvaginal technique was performed to assess early pregnancy.

[Series 1: us ob < 14 weeks - us ob tv · 132 acquisitions, 13 frames shown]
[im 5/132]
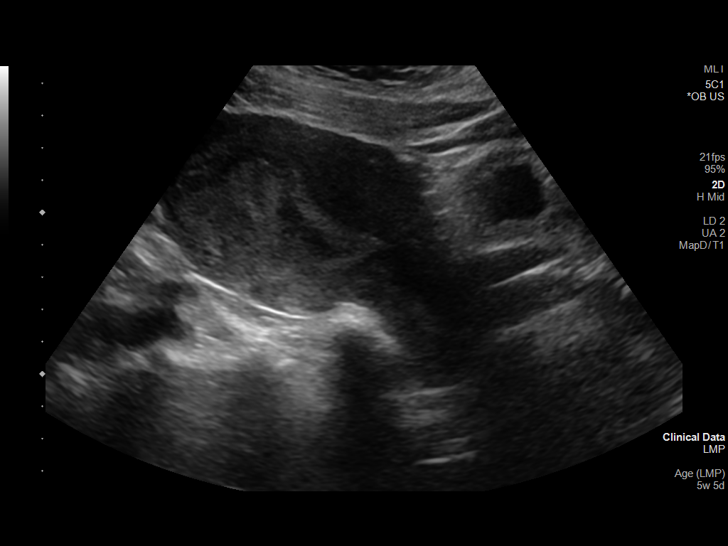
[im 15/132]
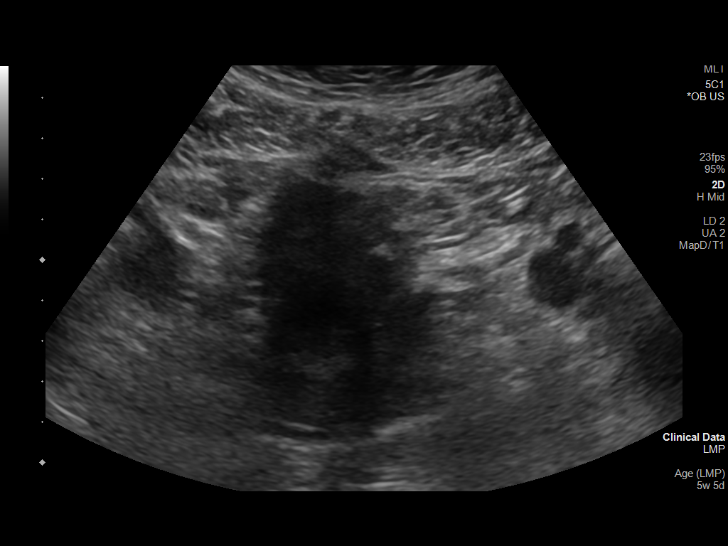
[im 25/132]
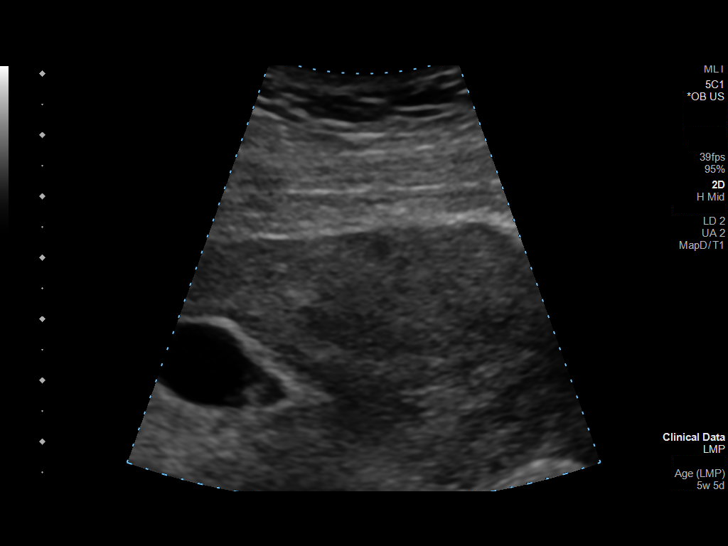
[im 34/132]
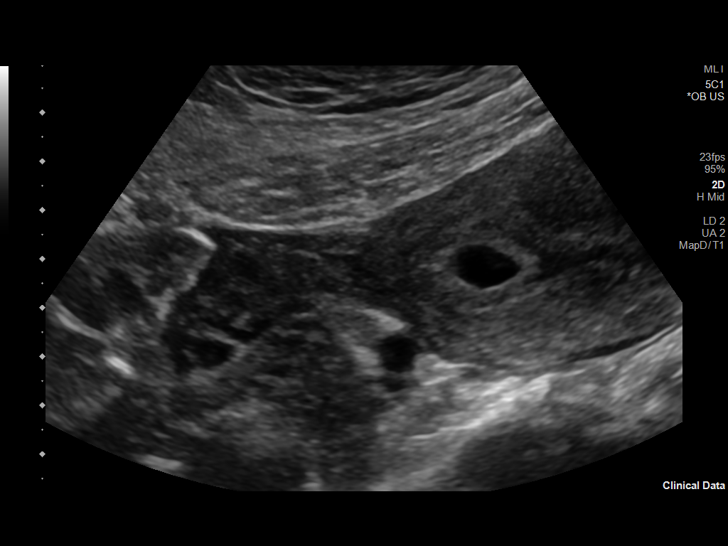
[im 44/132]
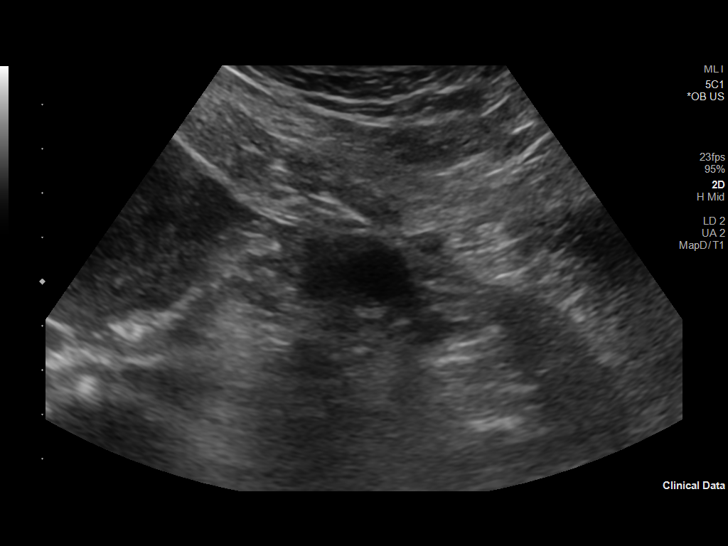
[im 54/132]
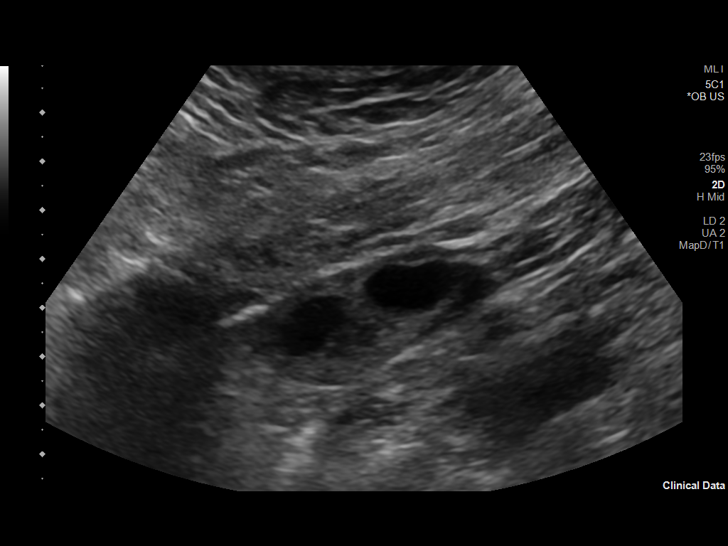
[im 68/132]
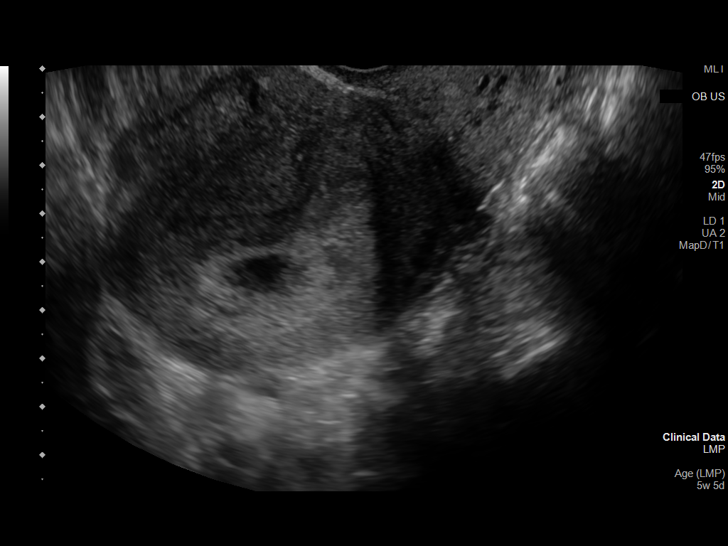
[im 78/132]
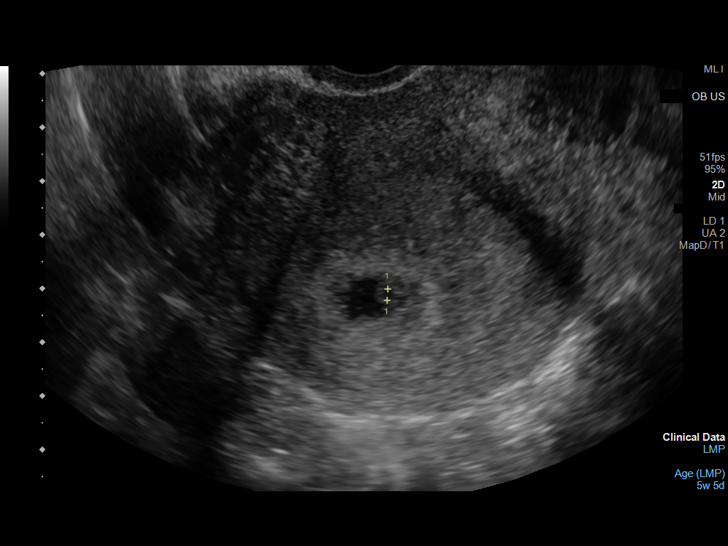
[im 88/132]
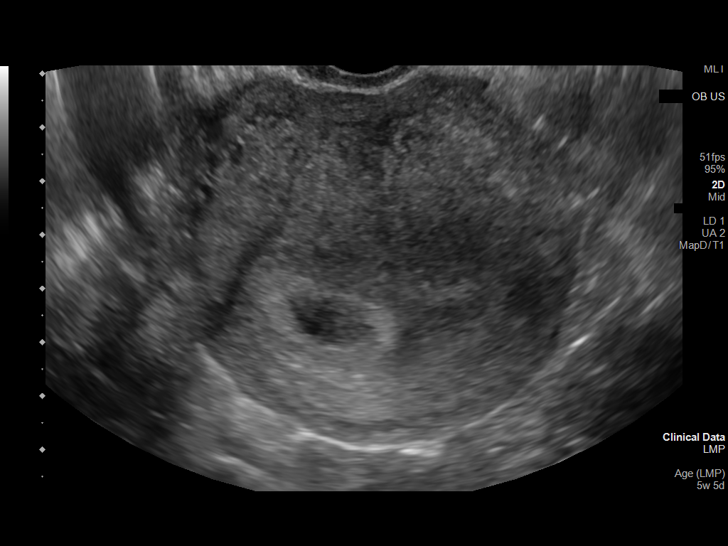
[im 98/132]
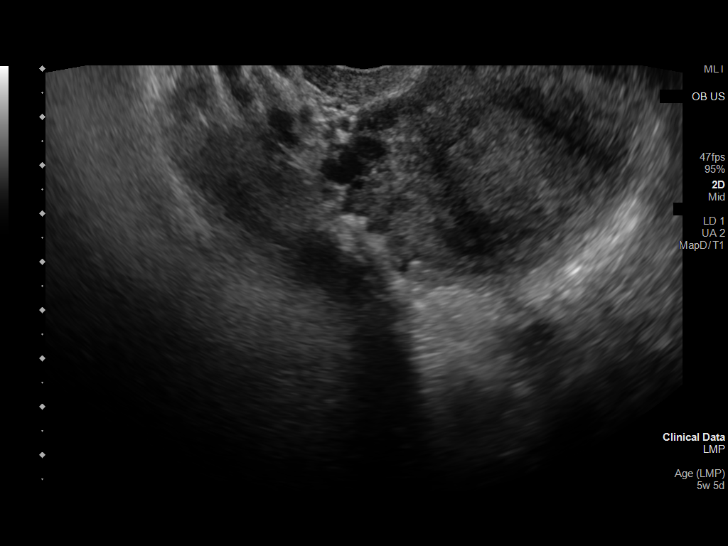
[im 107/132]
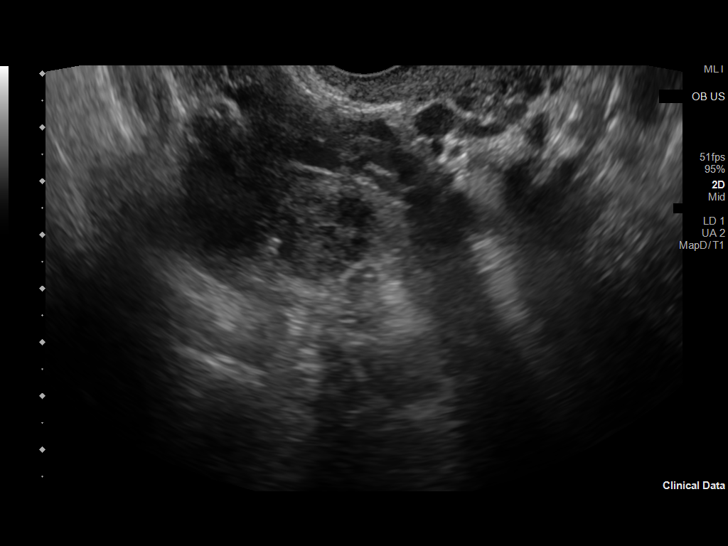
[im 117/132]
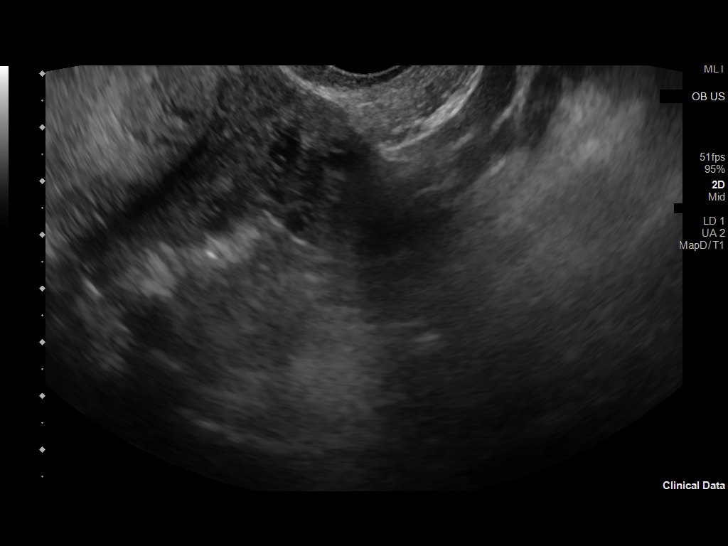
[im 127/132]
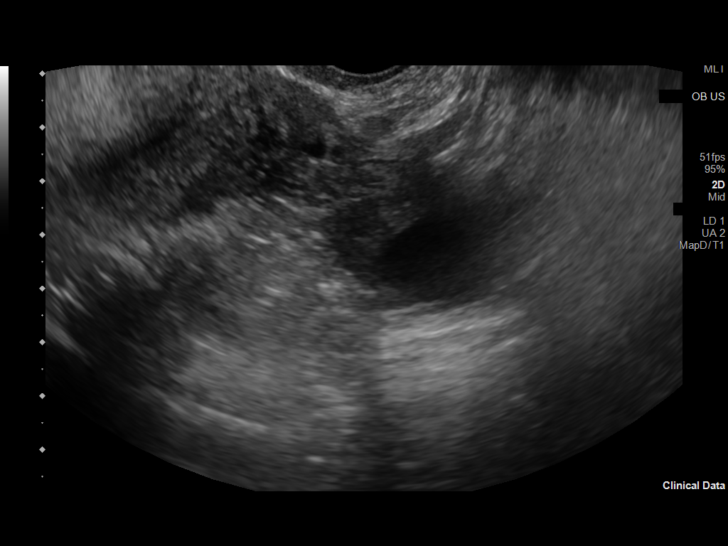

[13 of 28 positions shown; findings below may reference images not displayed]

FINDINGS: Intrauterine gestational sac: Single

Yolk sac:  Visualized.

Embryo:  Visualized.

Cardiac Activity: Visualized.

Heart Rate: 108 bpm

CRL:  8.2 mm   6 w   5 d                  US EDC: 12/06/2020

Subchorionic hemorrhage:  None visualized.

Maternal uterus/adnexae: The anteverted maternal uterus is otherwise
unremarkable. Right ovary measures 3.8 x 2.6 x 2.2 cm, volume
mL. No concerning right adnexal lesion. Left ovary measures 2.6 by
5.2 x 2.5 cm for a total volume the 17.6 mL, asymmetry in size
likely attributable to a pair of dominant follicles the left ovary
measuring up to 2.3 cm in size. No further specific imaging
follow-up is warranted. No free fluid.
IMPRESSION: Single intrauterine gestation with estimated gestational age of 6
weeks, 5 days based on crown-rump length measurement.

Fetal heart rate of 108 beats per minute is borderline low though
may be related to early gestation or technical factors given a
diminutive size of the fetal.

No other acute sonographic complication is evident.
